# Patient Record
Sex: Male | Born: 1940 | Race: White | Hispanic: No | Marital: Married | Smoking: Former smoker
Health system: Southern US, Community
[De-identification: ages and names within clinical notes are randomized; demographics above are authoritative.]

## PROBLEM LIST (undated history)

## (undated) DIAGNOSIS — D6869 Other thrombophilia: Secondary | ICD-10-CM

## (undated) DIAGNOSIS — Z7901 Long term (current) use of anticoagulants: Secondary | ICD-10-CM

## (undated) DIAGNOSIS — G3184 Mild cognitive impairment, so stated: Secondary | ICD-10-CM

## (undated) DIAGNOSIS — M169 Osteoarthritis of hip, unspecified: Secondary | ICD-10-CM

## (undated) DIAGNOSIS — I7121 Aneurysm of the ascending aorta, without rupture: Secondary | ICD-10-CM

## (undated) DIAGNOSIS — Z9989 Dependence on other enabling machines and devices: Secondary | ICD-10-CM

## (undated) DIAGNOSIS — M1990 Unspecified osteoarthritis, unspecified site: Secondary | ICD-10-CM

## (undated) DIAGNOSIS — I447 Left bundle-branch block, unspecified: Secondary | ICD-10-CM

## (undated) DIAGNOSIS — I712 Thoracic aortic aneurysm, without rupture: Secondary | ICD-10-CM

## (undated) DIAGNOSIS — H9190 Unspecified hearing loss, unspecified ear: Secondary | ICD-10-CM

## (undated) DIAGNOSIS — E785 Hyperlipidemia, unspecified: Secondary | ICD-10-CM

## (undated) DIAGNOSIS — I480 Paroxysmal atrial fibrillation: Secondary | ICD-10-CM

## (undated) DIAGNOSIS — I2510 Atherosclerotic heart disease of native coronary artery without angina pectoris: Secondary | ICD-10-CM

## (undated) DIAGNOSIS — J45909 Unspecified asthma, uncomplicated: Secondary | ICD-10-CM

## (undated) DIAGNOSIS — G4733 Obstructive sleep apnea (adult) (pediatric): Secondary | ICD-10-CM

## (undated) DIAGNOSIS — J42 Unspecified chronic bronchitis: Secondary | ICD-10-CM

## (undated) DIAGNOSIS — K219 Gastro-esophageal reflux disease without esophagitis: Secondary | ICD-10-CM

## (undated) DIAGNOSIS — J479 Bronchiectasis, uncomplicated: Secondary | ICD-10-CM

## (undated) DIAGNOSIS — J309 Allergic rhinitis, unspecified: Secondary | ICD-10-CM

## (undated) DIAGNOSIS — J189 Pneumonia, unspecified organism: Secondary | ICD-10-CM

## (undated) HISTORY — PX: JOINT REPLACEMENT: SHX530

## (undated) HISTORY — PX: BUNIONECTOMY WITH HAMMERTOE RECONSTRUCTION: SHX5600

## (undated) HISTORY — DX: Unspecified hearing loss, unspecified ear: H91.90

## (undated) HISTORY — PX: KNEE ARTHROSCOPY: SHX127

## (undated) HISTORY — DX: Left bundle-branch block, unspecified: I44.7

## (undated) HISTORY — DX: Long term (current) use of anticoagulants: Z79.01

## (undated) HISTORY — DX: Hyperlipidemia, unspecified: E78.5

## (undated) HISTORY — DX: Paroxysmal atrial fibrillation: I48.0

## (undated) HISTORY — DX: Allergic rhinitis, unspecified: J30.9

## (undated) HISTORY — DX: Unspecified osteoarthritis, unspecified site: M19.90

## (undated) HISTORY — DX: Other thrombophilia: D68.69

## (undated) HISTORY — DX: Mild cognitive impairment of uncertain or unknown etiology: G31.84

## (undated) HISTORY — DX: Atherosclerotic heart disease of native coronary artery without angina pectoris: I25.10

---

## 1998-12-08 ENCOUNTER — Ambulatory Visit (HOSPITAL_BASED_OUTPATIENT_CLINIC_OR_DEPARTMENT_OTHER): Admission: RE | Admit: 1998-12-08 | Discharge: 1998-12-08 | Payer: Self-pay | Admitting: Orthopedic Surgery

## 2006-10-17 ENCOUNTER — Ambulatory Visit: Payer: Self-pay | Admitting: Oncology

## 2007-02-13 ENCOUNTER — Inpatient Hospital Stay (HOSPITAL_COMMUNITY): Admission: RE | Admit: 2007-02-13 | Discharge: 2007-02-16 | Payer: Self-pay | Admitting: Orthopedic Surgery

## 2007-09-28 HISTORY — PX: TOTAL HIP ARTHROPLASTY: SHX124

## 2016-10-28 HISTORY — PX: CATARACT EXTRACTION W/ INTRAOCULAR LENS IMPLANT: SHX1309

## 2017-07-13 ENCOUNTER — Encounter (HOSPITAL_COMMUNITY): Payer: Self-pay

## 2017-07-13 ENCOUNTER — Encounter (HOSPITAL_COMMUNITY)
Admission: RE | Admit: 2017-07-13 | Discharge: 2017-07-13 | Disposition: A | Discharge status: Home / Self Care | Payer: Medicare Other | Source: Ambulatory Visit | Attending: Orthopedic Surgery | Admitting: Orthopedic Surgery

## 2017-07-13 DIAGNOSIS — Z01818 Encounter for other preprocedural examination: Secondary | ICD-10-CM | POA: Insufficient documentation

## 2017-07-13 DIAGNOSIS — R9431 Abnormal electrocardiogram [ECG] [EKG]: Secondary | ICD-10-CM | POA: Insufficient documentation

## 2017-07-13 DIAGNOSIS — J40 Bronchitis, not specified as acute or chronic: Secondary | ICD-10-CM | POA: Diagnosis not present

## 2017-07-13 DIAGNOSIS — I700 Atherosclerosis of aorta: Secondary | ICD-10-CM | POA: Insufficient documentation

## 2017-07-13 HISTORY — DX: Aneurysm of the ascending aorta, without rupture: I71.21

## 2017-07-13 HISTORY — DX: Thoracic aortic aneurysm, without rupture: I71.2

## 2017-07-13 HISTORY — DX: Unspecified asthma, uncomplicated: J45.909

## 2017-07-13 HISTORY — DX: Bronchiectasis, uncomplicated: J47.9

## 2017-07-13 LAB — SURGICAL PCR SCREEN
MRSA, PCR: NEGATIVE
STAPHYLOCOCCUS AUREUS: POSITIVE — AB

## 2017-07-13 LAB — BASIC METABOLIC PANEL
ANION GAP: 7 (ref 5–15)
BUN: 16 mg/dL (ref 6–20)
CHLORIDE: 104 mmol/L (ref 101–111)
CO2: 29 mmol/L (ref 22–32)
Calcium: 9.3 mg/dL (ref 8.9–10.3)
Creatinine, Ser: 1.08 mg/dL (ref 0.61–1.24)
GFR calc non Af Amer: >60 mL/min (ref >60)
Glucose, Bld: 115 mg/dL — ABNORMAL HIGH (ref 65–99)
POTASSIUM: 3.6 mmol/L (ref 3.5–5.1)
SODIUM: 140 mmol/L (ref 135–145)

## 2017-07-13 LAB — CBC WITH DIFFERENTIAL/PLATELET
Basophils Absolute: 0 10*3/uL (ref 0.0–0.1)
Basophils Relative: 1 %
EOS ABS: 0.1 10*3/uL (ref 0.0–0.7)
Eosinophils Relative: 2 %
HEMATOCRIT: 44.7 % (ref 39.0–52.0)
HEMOGLOBIN: 15.3 g/dL (ref 13.0–17.0)
LYMPHS ABS: 1.5 10*3/uL (ref 0.7–4.0)
LYMPHS PCT: 26 %
MCH: 31 pg (ref 26.0–34.0)
MCHC: 34.2 g/dL (ref 30.0–36.0)
MCV: 90.7 fL (ref 78.0–100.0)
Monocytes Absolute: 0.4 10*3/uL (ref 0.1–1.0)
Monocytes Relative: 6 %
NEUTROS ABS: 3.7 10*3/uL (ref 1.7–7.7)
NEUTROS PCT: 65 %
Platelets: 179 10*3/uL (ref 150–400)
RBC: 4.93 MIL/uL (ref 4.22–5.81)
RDW: 13.3 % (ref 11.5–15.5)
WBC: 5.8 10*3/uL (ref 4.0–10.5)

## 2017-07-13 LAB — PROTIME-INR
INR: 0.92
PROTHROMBIN TIME: 12.3 s (ref 11.4–15.2)

## 2017-07-13 LAB — URINALYSIS, ROUTINE W REFLEX MICROSCOPIC
BILIRUBIN URINE: NEGATIVE
GLUCOSE, UA: NEGATIVE mg/dL
Hgb urine dipstick: NEGATIVE
KETONES UR: NEGATIVE mg/dL
Leukocytes, UA: NEGATIVE
NITRITE: NEGATIVE
PH: 6 (ref 5.0–8.0)
Protein, ur: NEGATIVE mg/dL
Specific Gravity, Urine: 1.02 (ref 1.005–1.030)

## 2017-07-13 LAB — TYPE AND SCREEN
ABO/RH(D): O NEG
ANTIBODY SCREEN: NEGATIVE

## 2017-07-13 LAB — APTT: aPTT: 28 seconds (ref 24–36)

## 2017-07-13 NOTE — Progress Notes (Addendum)
PCP:Dr. Ilda Bassethomas Wolf  In [place], [place] [phone]) and Dr.  Sharlotte AlamoBendetowitcz in [place], [place].  No cardiologist  Pulmonologist: Dr. Crista LuriaHellreich in [place], [place] [phone]  Sleep study .5 yrs.

## 2017-07-13 NOTE — Pre-Procedure Instructions (Signed)
Dylan SmartDavid G Matthews  07/13/2017      [id number] - [place], [place] - [address] [place] [id number] Phone: [phone] Fax: [phone]    Your procedure is scheduled on Mon. Oct. 22  [facility] at 5:30 A.M.  Call this number if you have problems the morning of surgery:  [phone]   Remember:  Do not eat food or drink liquids after midnight on Sun. Oct. 21   Take these medicines the morning of surgery with A SIP OF WATER : albuterol if needed--bring to hospital, zyrtec, advair-bring to hospital,  7 days prior to surgery STOP taking any Aspirin (unless otherwise instructed by your surgeon), Aleve, Naproxen, Ibuprofen, Motrin, Advil, Goody's, BC's, all herbal medications, fish oil, and all vitamins   Do not wear jewelry.  Do not wear lotions, powders, or perfumes, or deoderant.  Do not shave 48 hours prior to surgery.  Men may shave face and neck.  Do not bring valuables to the hospital.  [facility] is not responsible for any belongings or valuables.  Contacts, dentures or bridgework may not be worn into surgery.  Leave your suitcase in the car.  After surgery it may be brought to your room.  For patients admitted to the hospital, discharge time will be determined by your treatment team.  Patients discharged the day of surgery will not be allowed to drive home.    Special instructions:  [facility]- Preparing For Surgery  Before surgery, you can play an important role. Because skin is not sterile, your skin needs to be as free of germs as possible. You can reduce the number of germs on your skin by washing with CHG (chlorahexidine gluconate) Soap before surgery.  CHG is an antiseptic cleaner which kills germs and bonds with the skin to continue killing germs even after washing.  Please do not use if you have an allergy to CHG or antibacterial soaps. If your skin becomes reddened/irritated stop using the CHG.  Do  not shave (including legs and underarms) for at least 48 hours prior to first CHG shower. It is OK to shave your face.  Please follow these instructions carefully.   1. Shower the NIGHT BEFORE SURGERY and the MORNING OF SURGERY with CHG.   2. If you chose to wash your hair, wash your hair first as usual with your normal shampoo.  3. After you shampoo, rinse your hair and body thoroughly to remove the shampoo.  4. Use CHG as you would any other liquid soap. You can apply CHG directly to the skin and wash gently with a scrungie or a clean washcloth.   5. Apply the CHG Soap to your body ONLY FROM THE NECK DOWN.  Do not use on open wounds or open sores. Avoid contact with your eyes, ears, mouth and genitals (private parts). Wash Face and genitals (private parts)  with your normal soap.  6. Wash thoroughly, paying special attention to the area where your surgery will be performed.  7. Thoroughly rinse your body with warm water from the neck down.  8. DO NOT shower/wash with your normal soap after using and rinsing off the CHG Soap.  9. Pat yourself dry with a CLEAN TOWEL.  10. Wear CLEAN PAJAMAS to bed the night before surgery, wear comfortable clothes the morning of surgery  11. Place CLEAN SHEETS on your bed the night of your first shower and DO NOT SLEEP WITH PETS.  Day of Surgery: Do not apply any deodorants/lotions. Please wear clean clothes to the hospital/surgery center.      Please read over the following fact sheets that you were given. Coughing and Deep Breathing, Total Joint Packet, MRSA Information and Surgical Site Infection Prevention

## 2017-07-14 ENCOUNTER — Encounter (HOSPITAL_COMMUNITY): Payer: Self-pay

## 2017-07-14 DIAGNOSIS — M1612 Unilateral primary osteoarthritis, left hip: Secondary | ICD-10-CM

## 2017-07-14 HISTORY — DX: Unilateral primary osteoarthritis, left hip: M16.12

## 2017-07-14 NOTE — H&P (Signed)
TOTAL HIP ADMISSION H&P  Patient is admitted for left total hip arthroplasty.  Subjective:  Chief Complaint: left hip pain  HPI: Dylan Matthews, 76 y.o. male, has a history of pain and functional disability in the left hip(s) due to arthritis and patient has failed non-surgical conservative treatments for greater than 12 weeks to include NSAID's and/or analgesics, flexibility and strengthening excercises, use of assistive devices, weight reduction as appropriate and activity modification.  Onset of symptoms was gradual starting 5 years ago with gradually worsening course since that time.The patient noted no past surgery on the left hip(s).  Patient currently rates pain in the left hip at 10 out of 10 with activity. Patient has night pain, worsening of pain with activity and weight bearing, pain that interfers with activities of daily living and pain with passive range of motion. Patient has evidence of subchondral cysts, subchondral sclerosis, periarticular osteophytes and joint space narrowing by imaging studies. This condition presents safety issues increasing the risk of falls.   There is no current active infection.  There are no active problems to display for this patient.  Past Medical History:  Diagnosis Date  . Arthritis   . Asthma   . Bronchitis   . Sleep apnea     Past Surgical History:  Procedure Laterality Date  . BUNIONECTOMY    . CATARACT EXTRACTION W/ INTRAOCULAR LENS IMPLANT Bilateral 12/2016  . KNEE ARTHROSCOPY Right   . TOTAL HIP ARTHROPLASTY Right 2009   Dr. Turner Daniels    No current facility-administered medications for this encounter.    Current Outpatient Prescriptions  Medication Sig Dispense Refill Last Dose  . albuterol (PROVENTIL HFA;VENTOLIN HFA) 108 (90 Base) MCG/ACT inhaler Inhale 1 puff into the lungs every 4 (four) hours as needed for wheezing or shortness of breath.     . cetirizine (ZYRTEC) 10 MG tablet Take 10 mg by mouth every morning.     . fluticasone  (FLONASE) 50 MCG/ACT nasal spray Place 2 sprays into both nostrils at bedtime.     . Fluticasone-Salmeterol (ADVAIR) 250-50 MCG/DOSE AEPB Inhale 1 puff into the lungs 2 (two) times daily.     . Grape Seed Extract 100 MG CAPS Take 1 capsule by mouth daily.     Barbra Sarks MAXIMUM STRENGTH PO Take 1,000 mg by mouth daily.     . Multiple Vitamins-Minerals (MULTIVITAMIN WITH MINERALS) tablet Take 1 tablet by mouth daily.     Marland Kitchen OVER THE COUNTER MEDICATION Take 2 tablets by mouth daily. LifeGuard     . sodium chloride 0.9 % nebulizer solution Take 3 mLs by nebulization as needed for wheezing.      Allergies  Allergen Reactions  . Ciprofloxacin Other (See Comments)    Face beet red - made pt itch really bad    Social History  Substance Use Topics  . Smoking status: Former Smoker    Quit date: 07/14/1959  . Smokeless tobacco: Never Used  . Alcohol use 1.2 oz/week    1 Glasses of wine, 1 Cans of beer per week    No family history on file.   Review of Systems  Constitutional: Negative.   HENT: Negative.   Eyes: Negative.   Respiratory:       Hx of bronchitis  Cardiovascular: Negative.   Gastrointestinal: Negative.   Genitourinary: Negative.   Musculoskeletal: Positive for joint pain.  Skin: Negative.   Neurological: Negative.   Endo/Heme/Allergies: Negative.   Psychiatric/Behavioral: Negative.     Objective:  Physical  Exam  Constitutional: He is oriented to person, place, and time. He appears well-developed and well-nourished.  HENT:  Head: Normocephalic and atraumatic.  Eyes: Pupils are equal, round, and reactive to light.  Neck: Normal range of motion. Neck supple.  Cardiovascular: Intact distal pulses.   Respiratory: Effort normal.  Musculoskeletal: He exhibits tenderness.  Patient does walk with a left-sided antalgic gait.  Surgical scar to the right hip is well healed internal and external rotation, 30 each on the left side, any attempts at internal rotation causes  significant and severe pain.  Neurovascular intact distally.  Foot tap is negative.  Neurological: He is alert and oriented to person, place, and time.  Skin: Skin is warm and dry.  Psychiatric: He has a normal mood and affect. His behavior is normal. Judgment and thought content normal.    Vital signs in last 24 hours: Temp:  [97.6 F (36.4 C)] 97.6 F (36.4 C) (10/17 1013) Pulse Rate:  [61] 61 (10/17 1015) Resp:  [20] 20 (10/17 1013) BP: (148)/(72) 148/72 (10/17 1013) SpO2:  [99 %] 99 % (10/17 1013) Weight:  [88.2 kg (194 lb 6.4 oz)] 88.2 kg (194 lb 6.4 oz) (10/17 1011)  Labs:   Estimated body mass index is 28.71 kg/m as calculated from the following:   Height as of 07/13/17: 5\' 9"  (1.753 m).   Weight as of 07/13/17: 88.2 kg (194 lb 6.4 oz).   Imaging Review Plain radiographs demonstrate AP pelvis and crosstable lateral shows well-placed well fixed DePuy ASR on S-ROM hip on the right.  No evidence of ostial lysis.  On the left side.  He does have bone-on-bone arthritic changes, peripheral osteophytes, subchondral cysts and early flattening of the femoral head in the weightbearing dome.  Assessment/Plan:  End stage arthritis, left hip(s)  The patient history, physical examination, clinical judgement of the provider and imaging studies are consistent with end stage degenerative joint disease of the left hip(s) and total hip arthroplasty is deemed medically necessary. The treatment options including medical management, injection therapy, arthroscopy and arthroplasty were discussed at length. The risks and benefits of total hip arthroplasty were presented and reviewed. The risks due to aseptic loosening, infection, stiffness, dislocation/subluxation,  thromboembolic complications and other imponderables were discussed.  The patient acknowledged the explanation, agreed to proceed with the plan and consent was signed. Patient is being admitted for inpatient treatment for surgery, pain  control, PT, OT, prophylactic antibiotics, VTE prophylaxis, progressive ambulation and ADL's and discharge planning.The patient is planning to be discharged home with home health services

## 2017-07-14 NOTE — Progress Notes (Signed)
Anesthesia Chart Review:  Pt is a 76 year old male scheduled for L total hip arthroplasty anterior approach on 07/18/2017 with Gean BirchwoodFrank Rowan, MD  - PCP is Dr. Ilda Bassethomas Wolf in [place], [place] [phone]) and Dr. Sharlotte Alamo[place]endetowitcz [place], [place] - Pulmonologist is Lynnae JanuaryMark Hellreich, MD at [facility] in [place], [place] [phone]). Last office visit 04/19/17, 1 year f/u recommended.   PMH includes:  Thoracic aortic aneurysm, bronchiectasis, OSA, asthma.  Former smoker. BMI 29  Medications include: albuterol, advair  BP (!) 148/72   Pulse 61   Temp 36.4 C   Resp 20   Ht 5\' 9"  (1.753 m)   Wt 194 lb 6.4 oz (88.2 kg)   SpO2 99%   BMI 28.71 kg/m   Preoperative labs reviewed.    CXR 07/13/17: Chronic bronchitic-reactive airway changes. No pneumonia nor other acute cardiopulmonary abnormality. Thoracic aortic atherosclerosis.  CT chest 04/26/17 [facility]  1. No lung mass of pulmonary infiltrate 2. 4.3cm ascending aortic aneurysm  EKG 07/13/17: sinus bradycardia (58 bpm). LAD. LVH with QRS widening and repolarization abnormality.   If no changes, I anticipate pt can proceed with surgery as scheduled.   Rica Mastngela Marvelyn Bouchillon, FNP-BC [facility]An[facility]hesiology Phone: [phone] 07/14/2017 10:29 AM

## 2017-07-15 MED ORDER — BUPIVACAINE LIPOSOME 1.3 % IJ SUSP
20.0000 mL | INTRAMUSCULAR | Status: AC
  Administered 2017-07-18: 20 mL
  Filled 2017-07-15: qty 20

## 2017-07-15 MED ORDER — TRANEXAMIC ACID 1000 MG/10ML IV SOLN
1000.0000 mg | INTRAVENOUS | Status: AC
  Administered 2017-07-18: 1000 mg via INTRAVENOUS
  Filled 2017-07-15: qty 10

## 2017-07-15 MED ORDER — TRANEXAMIC ACID 1000 MG/10ML IV SOLN
2000.0000 mg | INTRAVENOUS | Status: AC
  Administered 2017-07-18: 2000 mg via TOPICAL
  Filled 2017-07-15: qty 20

## 2017-07-17 NOTE — Anesthesia Preprocedure Evaluation (Addendum)
Anesthesia Evaluation  Patient identified by MRN, date of birth, ID band Patient awake    Reviewed: Allergy & Precautions, NPO status , Patient's Chart, lab work & pertinent test results  History of Anesthesia Complications Negative for: history of anesthetic complications  Airway Mallampati: II  TM Distance: >3 FB Neck ROM: Full    Dental  (+) Chipped, Dental Advisory Given   Pulmonary sleep apnea (does not require CPAP) , COPD,  COPD inhaler, former smoker (quit 1960),    breath sounds clear to auscultation       Cardiovascular (-) angina+ Peripheral Vascular Disease (ascending thoracic aortic aneurysm 4.3 cm)  negative cardio ROS   Rhythm:Regular Rate:Normal     Neuro/Psych negative neurological ROS     GI/Hepatic negative GI ROS, Neg liver ROS,   Endo/Other  negative endocrine ROS  Renal/GU negative Renal ROS     Musculoskeletal  (+) Arthritis , Osteoarthritis,    Abdominal   Peds  Hematology negative hematology ROS (+)   Anesthesia Other Findings   Reproductive/Obstetrics                           Anesthesia Physical Anesthesia Plan  ASA: III  Anesthesia Plan: Spinal   Post-op Pain Management:    Induction:   PONV Risk Score and Plan: 1 and Ondansetron and Dexamethasone  Airway Management Planned: Natural Airway and Simple Face Mask  Additional Equipment:   Intra-op Plan:   Post-operative Plan:   Informed Consent: I have reviewed the patients History and Physical, chart, labs and discussed the procedure including the risks, benefits and alternatives for the proposed anesthesia with the patient or authorized representative who has indicated his/her understanding and acceptance.   Dental advisory given  Plan Discussed with: CRNA and Surgeon  Anesthesia Plan Comments: (Plan routine monitors, SAB)        Anesthesia Quick Evaluation

## 2017-07-18 ENCOUNTER — Encounter (HOSPITAL_COMMUNITY)
Admission: RE | Disposition: A | Discharge status: Home Health | Payer: Self-pay | Source: Ambulatory Visit | Attending: Orthopedic Surgery

## 2017-07-18 ENCOUNTER — Encounter (HOSPITAL_COMMUNITY): Payer: Self-pay | Admitting: *Deleted

## 2017-07-18 ENCOUNTER — Inpatient Hospital Stay (HOSPITAL_COMMUNITY): Payer: Medicare Other

## 2017-07-18 ENCOUNTER — Inpatient Hospital Stay (HOSPITAL_COMMUNITY): Payer: Medicare Other | Admitting: Emergency Medicine

## 2017-07-18 ENCOUNTER — Inpatient Hospital Stay (HOSPITAL_COMMUNITY)
Admission: RE | Admit: 2017-07-18 | Discharge: 2017-07-19 | DRG: 470 | Disposition: A | Discharge status: Home Health | Payer: Medicare Other | Source: Ambulatory Visit | Attending: Orthopedic Surgery | Admitting: Orthopedic Surgery

## 2017-07-18 ENCOUNTER — Inpatient Hospital Stay (HOSPITAL_COMMUNITY): Payer: Medicare Other | Admitting: Anesthesiology

## 2017-07-18 DIAGNOSIS — J449 Chronic obstructive pulmonary disease, unspecified: Secondary | ICD-10-CM | POA: Diagnosis not present

## 2017-07-18 DIAGNOSIS — Z7951 Long term (current) use of inhaled steroids: Secondary | ICD-10-CM

## 2017-07-18 DIAGNOSIS — K219 Gastro-esophageal reflux disease without esophagitis: Secondary | ICD-10-CM | POA: Diagnosis not present

## 2017-07-18 DIAGNOSIS — Z79899 Other long term (current) drug therapy: Secondary | ICD-10-CM

## 2017-07-18 DIAGNOSIS — J45909 Unspecified asthma, uncomplicated: Secondary | ICD-10-CM | POA: Diagnosis present

## 2017-07-18 DIAGNOSIS — Z96641 Presence of right artificial hip joint: Secondary | ICD-10-CM | POA: Diagnosis not present

## 2017-07-18 DIAGNOSIS — G4733 Obstructive sleep apnea (adult) (pediatric): Secondary | ICD-10-CM | POA: Diagnosis not present

## 2017-07-18 DIAGNOSIS — M25552 Pain in left hip: Secondary | ICD-10-CM | POA: Diagnosis present

## 2017-07-18 DIAGNOSIS — Z419 Encounter for procedure for purposes other than remedying health state, unspecified: Secondary | ICD-10-CM

## 2017-07-18 DIAGNOSIS — M1612 Unilateral primary osteoarthritis, left hip: Principal | ICD-10-CM | POA: Diagnosis present

## 2017-07-18 DIAGNOSIS — Z87891 Personal history of nicotine dependence: Secondary | ICD-10-CM | POA: Diagnosis not present

## 2017-07-18 HISTORY — DX: Unilateral primary osteoarthritis, left hip: M16.12

## 2017-07-18 HISTORY — DX: Unspecified chronic bronchitis: J42

## 2017-07-18 HISTORY — DX: Osteoarthritis of hip, unspecified: M16.9

## 2017-07-18 HISTORY — DX: Pneumonia, unspecified organism: J18.9

## 2017-07-18 HISTORY — DX: Obstructive sleep apnea (adult) (pediatric): G47.33

## 2017-07-18 HISTORY — DX: Dependence on other enabling machines and devices: Z99.89

## 2017-07-18 HISTORY — DX: Gastro-esophageal reflux disease without esophagitis: K21.9

## 2017-07-18 HISTORY — PX: TOTAL HIP ARTHROPLASTY: SHX124

## 2017-07-18 SURGERY — ARTHROPLASTY, HIP, TOTAL, ANTERIOR APPROACH
Anesthesia: Spinal | Site: Hip | Laterality: Left

## 2017-07-18 MED ORDER — TRANEXAMIC ACID 1000 MG/10ML IV SOLN
1000.0000 mg | Freq: Once | INTRAVENOUS | Status: AC
  Administered 2017-07-18: 1000 mg via INTRAVENOUS
  Filled 2017-07-18: qty 10

## 2017-07-18 MED ORDER — EPHEDRINE SULFATE-NACL 50-0.9 MG/10ML-% IV SOSY
PREFILLED_SYRINGE | INTRAVENOUS | Status: DC | PRN
  Administered 2017-07-18 (×2): 5 mg via INTRAVENOUS

## 2017-07-18 MED ORDER — GABAPENTIN 300 MG PO CAPS: 300.0000 mg | ORAL_CAPSULE | Freq: Three times a day (TID) | ORAL | 2 refills | Status: DC

## 2017-07-18 MED ORDER — OXYCODONE-ACETAMINOPHEN 5-325 MG PO TABS: 1.0000 | ORAL_TABLET | ORAL | 0 refills | Status: DC | PRN

## 2017-07-18 MED ORDER — LIDOCAINE 2% (20 MG/ML) 5 ML SYRINGE
INTRAMUSCULAR | Status: AC
  Filled 2017-07-18: qty 5

## 2017-07-18 MED ORDER — FENTANYL CITRATE (PF) 250 MCG/5ML IJ SOLN
INTRAMUSCULAR | Status: AC
  Filled 2017-07-18: qty 5

## 2017-07-18 MED ORDER — EPHEDRINE 5 MG/ML INJ
INTRAVENOUS | Status: AC
  Filled 2017-07-18: qty 10

## 2017-07-18 MED ORDER — CHLORHEXIDINE GLUCONATE 4 % EX LIQD: 60.0000 mL | Freq: Once | CUTANEOUS | Status: DC

## 2017-07-18 MED ORDER — FLEET ENEMA 7-19 GM/118ML RE ENEM: 1.0000 | ENEMA | Freq: Once | RECTAL | Status: DC | PRN

## 2017-07-18 MED ORDER — ASPIRIN EC 325 MG PO TBEC: 325.0000 mg | DELAYED_RELEASE_TABLET | Freq: Two times a day (BID) | ORAL | 0 refills | Status: DC

## 2017-07-18 MED ORDER — LACTATED RINGERS IV SOLN
INTRAVENOUS | Status: DC | PRN
  Administered 2017-07-18 (×2): via INTRAVENOUS

## 2017-07-18 MED ORDER — BISACODYL 5 MG PO TBEC: 5.0000 mg | DELAYED_RELEASE_TABLET | Freq: Every day | ORAL | Status: DC | PRN

## 2017-07-18 MED ORDER — LACTATED RINGERS IV SOLN: INTRAVENOUS | Status: DC

## 2017-07-18 MED ORDER — DEXAMETHASONE SODIUM PHOSPHATE 10 MG/ML IJ SOLN
10.0000 mg | Freq: Once | INTRAMUSCULAR | Status: AC
  Administered 2017-07-19: 10 mg via INTRAVENOUS
  Filled 2017-07-18: qty 1

## 2017-07-18 MED ORDER — OXYCODONE HCL 5 MG PO TABS: 10.0000 mg | ORAL_TABLET | ORAL | Status: DC | PRN

## 2017-07-18 MED ORDER — ONDANSETRON HCL 4 MG PO TABS: 4.0000 mg | ORAL_TABLET | Freq: Four times a day (QID) | ORAL | Status: DC | PRN

## 2017-07-18 MED ORDER — ONDANSETRON HCL 4 MG/2ML IJ SOLN: 4.0000 mg | Freq: Four times a day (QID) | INTRAMUSCULAR | Status: DC | PRN

## 2017-07-18 MED ORDER — LIDOCAINE 2% (20 MG/ML) 5 ML SYRINGE
INTRAMUSCULAR | Status: DC | PRN
Start: 2017-07-18 — End: 2017-07-18
  Administered 2017-07-18: 40 mg via INTRAVENOUS

## 2017-07-18 MED ORDER — MENTHOL 3 MG MT LOZG: 1.0000 | LOZENGE | OROMUCOSAL | Status: DC | PRN

## 2017-07-18 MED ORDER — METOCLOPRAMIDE HCL 5 MG PO TABS: 5.0000 mg | ORAL_TABLET | Freq: Three times a day (TID) | ORAL | Status: DC | PRN

## 2017-07-18 MED ORDER — BUPIVACAINE IN DEXTROSE 0.75-8.25 % IT SOLN
INTRATHECAL | Status: DC | PRN
  Administered 2017-07-18: 15 mg via INTRATHECAL

## 2017-07-18 MED ORDER — DEXAMETHASONE SODIUM PHOSPHATE 4 MG/ML IJ SOLN
INTRAMUSCULAR | Status: DC | PRN
  Administered 2017-07-18: 8 mg via INTRAVENOUS

## 2017-07-18 MED ORDER — PROPOFOL 10 MG/ML IV BOLUS
INTRAVENOUS | Status: DC | PRN
  Administered 2017-07-18 (×2): 20 mg via INTRAVENOUS

## 2017-07-18 MED ORDER — ONDANSETRON HCL 4 MG/2ML IJ SOLN
INTRAMUSCULAR | Status: AC
  Filled 2017-07-18: qty 2

## 2017-07-18 MED ORDER — HYDROMORPHONE HCL 1 MG/ML IJ SOLN: 0.2500 mg | INTRAMUSCULAR | Status: DC | PRN

## 2017-07-18 MED ORDER — PHENYLEPHRINE HCL 10 MG/ML IJ SOLN
INTRAVENOUS | Status: DC | PRN
  Administered 2017-07-18: 40 ug/min via INTRAVENOUS

## 2017-07-18 MED ORDER — LORATADINE 10 MG PO TABS
10.0000 mg | ORAL_TABLET | Freq: Every day | ORAL | Status: DC
  Administered 2017-07-18: 10 mg via ORAL
  Filled 2017-07-18 (×2): qty 1

## 2017-07-18 MED ORDER — CELECOXIB 200 MG PO CAPS: 200.0000 mg | ORAL_CAPSULE | Freq: Two times a day (BID) | ORAL | 2 refills | Status: AC

## 2017-07-18 MED ORDER — KCL IN DEXTROSE-NACL 20-5-0.45 MEQ/L-%-% IV SOLN
INTRAVENOUS | Status: DC
  Administered 2017-07-18: 13:00:00 via INTRAVENOUS
  Filled 2017-07-18: qty 1000

## 2017-07-18 MED ORDER — METHOCARBAMOL 1000 MG/10ML IJ SOLN
500.0000 mg | Freq: Four times a day (QID) | INTRAVENOUS | Status: DC | PRN
  Filled 2017-07-18: qty 5

## 2017-07-18 MED ORDER — DOCUSATE SODIUM 100 MG PO CAPS
100.0000 mg | ORAL_CAPSULE | Freq: Two times a day (BID) | ORAL | Status: DC
  Administered 2017-07-18 – 2017-07-19 (×3): 100 mg via ORAL
  Filled 2017-07-18 (×3): qty 1

## 2017-07-18 MED ORDER — ASPIRIN EC 325 MG PO TBEC
325.0000 mg | DELAYED_RELEASE_TABLET | Freq: Every day | ORAL | Status: DC
  Administered 2017-07-19: 325 mg via ORAL
  Filled 2017-07-18: qty 1

## 2017-07-18 MED ORDER — BUPIVACAINE-EPINEPHRINE (PF) 0.5% -1:200000 IJ SOLN
INTRAMUSCULAR | Status: DC | PRN
  Administered 2017-07-18: 50 mL via PERINEURAL

## 2017-07-18 MED ORDER — METHOCARBAMOL 500 MG PO TABS: 500.0000 mg | ORAL_TABLET | Freq: Four times a day (QID) | ORAL | Status: DC | PRN

## 2017-07-18 MED ORDER — DIPHENHYDRAMINE HCL 12.5 MG/5ML PO ELIX: 12.5000 mg | ORAL_SOLUTION | ORAL | Status: DC | PRN

## 2017-07-18 MED ORDER — PROPOFOL 500 MG/50ML IV EMUL
INTRAVENOUS | Status: DC | PRN
  Administered 2017-07-18: 50 ug/kg/min via INTRAVENOUS

## 2017-07-18 MED ORDER — GABAPENTIN 300 MG PO CAPS
300.0000 mg | ORAL_CAPSULE | Freq: Three times a day (TID) | ORAL | Status: DC
  Administered 2017-07-18 – 2017-07-19 (×3): 300 mg via ORAL
  Filled 2017-07-18 (×3): qty 1

## 2017-07-18 MED ORDER — ALUMINUM HYDROXIDE GEL 320 MG/5ML PO SUSP
15.0000 mL | ORAL | Status: DC | PRN
  Filled 2017-07-18: qty 30

## 2017-07-18 MED ORDER — FLUTICASONE PROPIONATE 50 MCG/ACT NA SUSP
2.0000 | Freq: Every day | NASAL | Status: DC
  Administered 2017-07-18: 2 via NASAL
  Filled 2017-07-18: qty 16

## 2017-07-18 MED ORDER — MIDAZOLAM HCL 2 MG/2ML IJ SOLN
INTRAMUSCULAR | Status: AC
  Filled 2017-07-18: qty 2

## 2017-07-18 MED ORDER — OXYCODONE HCL 5 MG PO TABS: 5.0000 mg | ORAL_TABLET | ORAL | Status: DC | PRN

## 2017-07-18 MED ORDER — CELECOXIB 200 MG PO CAPS
200.0000 mg | ORAL_CAPSULE | Freq: Two times a day (BID) | ORAL | Status: DC
  Administered 2017-07-18 – 2017-07-19 (×3): 200 mg via ORAL
  Filled 2017-07-18 (×3): qty 1

## 2017-07-18 MED ORDER — ALBUTEROL SULFATE (2.5 MG/3ML) 0.083% IN NEBU: 3.0000 mL | INHALATION_SOLUTION | RESPIRATORY_TRACT | Status: DC | PRN

## 2017-07-18 MED ORDER — MEPERIDINE HCL 25 MG/ML IJ SOLN: 6.2500 mg | INTRAMUSCULAR | Status: DC | PRN

## 2017-07-18 MED ORDER — POLYETHYLENE GLYCOL 3350 17 G PO PACK: 17.0000 g | PACK | Freq: Every day | ORAL | Status: DC | PRN

## 2017-07-18 MED ORDER — PHENYLEPHRINE 40 MCG/ML (10ML) SYRINGE FOR IV PUSH (FOR BLOOD PRESSURE SUPPORT)
PREFILLED_SYRINGE | INTRAVENOUS | Status: AC
  Filled 2017-07-18: qty 10

## 2017-07-18 MED ORDER — PHENYLEPHRINE 40 MCG/ML (10ML) SYRINGE FOR IV PUSH (FOR BLOOD PRESSURE SUPPORT)
PREFILLED_SYRINGE | INTRAVENOUS | Status: DC | PRN
  Administered 2017-07-18: 80 ug via INTRAVENOUS

## 2017-07-18 MED ORDER — ACETAMINOPHEN 650 MG RE SUPP: 650.0000 mg | RECTAL | Status: DC | PRN

## 2017-07-18 MED ORDER — BUPIVACAINE-EPINEPHRINE (PF) 0.5% -1:200000 IJ SOLN
INTRAMUSCULAR | Status: AC
  Filled 2017-07-18: qty 30

## 2017-07-18 MED ORDER — ONDANSETRON HCL 4 MG/2ML IJ SOLN
INTRAMUSCULAR | Status: DC | PRN
  Administered 2017-07-18: 4 mg via INTRAVENOUS

## 2017-07-18 MED ORDER — MOMETASONE FURO-FORMOTEROL FUM 200-5 MCG/ACT IN AERO
2.0000 | INHALATION_SPRAY | Freq: Two times a day (BID) | RESPIRATORY_TRACT | Status: DC
  Filled 2017-07-18: qty 8.8

## 2017-07-18 MED ORDER — CEFAZOLIN SODIUM-DEXTROSE 2-4 GM/100ML-% IV SOLN
2.0000 g | INTRAVENOUS | Status: AC
  Administered 2017-07-18: 2 g via INTRAVENOUS

## 2017-07-18 MED ORDER — HYDROMORPHONE HCL 1 MG/ML IJ SOLN: 1.0000 mg | INTRAMUSCULAR | Status: DC | PRN

## 2017-07-18 MED ORDER — METOCLOPRAMIDE HCL 5 MG/ML IJ SOLN: 5.0000 mg | Freq: Three times a day (TID) | INTRAMUSCULAR | Status: DC | PRN

## 2017-07-18 MED ORDER — DEXAMETHASONE SODIUM PHOSPHATE 10 MG/ML IJ SOLN
INTRAMUSCULAR | Status: AC
  Filled 2017-07-18: qty 1

## 2017-07-18 MED ORDER — 0.9 % SODIUM CHLORIDE (POUR BTL) OPTIME
TOPICAL | Status: DC | PRN
  Administered 2017-07-18: 1000 mL

## 2017-07-18 MED ORDER — MIDAZOLAM HCL 2 MG/2ML IJ SOLN: 0.5000 mg | Freq: Once | INTRAMUSCULAR | Status: DC | PRN

## 2017-07-18 MED ORDER — ACETAMINOPHEN 325 MG PO TABS: 650.0000 mg | ORAL_TABLET | ORAL | Status: DC | PRN

## 2017-07-18 MED ORDER — CEFAZOLIN SODIUM-DEXTROSE 2-4 GM/100ML-% IV SOLN
INTRAVENOUS | Status: AC
  Filled 2017-07-18: qty 100

## 2017-07-18 MED ORDER — PROMETHAZINE HCL 25 MG/ML IJ SOLN: 6.2500 mg | INTRAMUSCULAR | Status: DC | PRN

## 2017-07-18 MED ORDER — PHENOL 1.4 % MT LIQD: 1.0000 | OROMUCOSAL | Status: DC | PRN

## 2017-07-18 MED ORDER — SODIUM CHLORIDE 0.9 % IN NEBU
3.0000 mL | INHALATION_SOLUTION | RESPIRATORY_TRACT | Status: DC | PRN
  Filled 2017-07-18: qty 3

## 2017-07-18 MED ORDER — MIDAZOLAM HCL 5 MG/5ML IJ SOLN
INTRAMUSCULAR | Status: DC | PRN
  Administered 2017-07-18: 2 mg via INTRAVENOUS

## 2017-07-18 MED ORDER — TIZANIDINE HCL 2 MG PO TABS: 2.0000 mg | ORAL_TABLET | Freq: Four times a day (QID) | ORAL | 0 refills | Status: DC | PRN

## 2017-07-18 SURGICAL SUPPLY — 44 items
BAG DECANTER FOR FLEXI CONT (MISCELLANEOUS) ×2 IMPLANT
BLADE SAW SGTL 18X1.27X75 (BLADE) ×2 IMPLANT
CAPT HIP TOTAL 2 ×1 IMPLANT
COVER PERINEAL POST (MISCELLANEOUS) ×2 IMPLANT
COVER SURGICAL LIGHT HANDLE (MISCELLANEOUS) ×2 IMPLANT
DRAPE C-ARM 42X72 X-RAY (DRAPES) ×2 IMPLANT
DRAPE STERI IOBAN 125X83 (DRAPES) ×2 IMPLANT
DRAPE U-SHAPE 47X51 STRL (DRAPES) ×4 IMPLANT
DRSG AQUACEL AG ADV 3.5X10 (GAUZE/BANDAGES/DRESSINGS) ×2 IMPLANT
DURAPREP 26ML APPLICATOR (WOUND CARE) ×2 IMPLANT
ELECT BLADE 4.0 EZ CLEAN MEGAD (MISCELLANEOUS) ×2
ELECT REM PT RETURN 9FT ADLT (ELECTROSURGICAL) ×2
ELECTRODE BLDE 4.0 EZ CLN MEGD (MISCELLANEOUS) ×1 IMPLANT
ELECTRODE REM PT RTRN 9FT ADLT (ELECTROSURGICAL) ×1 IMPLANT
FACESHIELD WRAPAROUND (MASK) ×6 IMPLANT
FACESHIELD WRAPAROUND OR TEAM (MASK) ×2 IMPLANT
GLOVE BIO SURGEON STRL SZ7.5 (GLOVE) ×2 IMPLANT
GLOVE BIO SURGEON STRL SZ8.5 (GLOVE) ×2 IMPLANT
GLOVE BIOGEL PI IND STRL 8 (GLOVE) ×1 IMPLANT
GLOVE BIOGEL PI IND STRL 9 (GLOVE) ×1 IMPLANT
GLOVE BIOGEL PI INDICATOR 8 (GLOVE) ×1
GLOVE BIOGEL PI INDICATOR 9 (GLOVE) ×1
GOWN STRL REUS W/ TWL LRG LVL3 (GOWN DISPOSABLE) ×1 IMPLANT
GOWN STRL REUS W/ TWL XL LVL3 (GOWN DISPOSABLE) ×2 IMPLANT
GOWN STRL REUS W/TWL LRG LVL3 (GOWN DISPOSABLE) ×2
GOWN STRL REUS W/TWL XL LVL3 (GOWN DISPOSABLE) ×4
KIT BASIN OR (CUSTOM PROCEDURE TRAY) ×2 IMPLANT
KIT ROOM TURNOVER OR (KITS) ×2 IMPLANT
MANIFOLD NEPTUNE II (INSTRUMENTS) ×2 IMPLANT
NEEDLE HYPO 22GX1.5 SAFETY (NEEDLE) ×4 IMPLANT
NS IRRIG 1000ML POUR BTL (IV SOLUTION) ×2 IMPLANT
PACK TOTAL JOINT (CUSTOM PROCEDURE TRAY) ×2 IMPLANT
PAD ARMBOARD 7.5X6 YLW CONV (MISCELLANEOUS) ×4 IMPLANT
SLEEVE SURGEON STRL (DRAPES) ×1 IMPLANT
SUT VIC AB 1 CTX 36 (SUTURE) ×4
SUT VIC AB 1 CTX36XBRD ANBCTR (SUTURE) ×1 IMPLANT
SUT VIC AB 2-0 CT1 27 (SUTURE) ×2
SUT VIC AB 2-0 CT1 TAPERPNT 27 (SUTURE) ×1 IMPLANT
SUT VIC AB 3-0 CT1 27 (SUTURE) ×2
SUT VIC AB 3-0 CT1 TAPERPNT 27 (SUTURE) ×1 IMPLANT
SYR CONTROL 10ML LL (SYRINGE) ×4 IMPLANT
TOWEL OR 17X24 6PK STRL BLUE (TOWEL DISPOSABLE) ×2 IMPLANT
TOWEL OR 17X26 10 PK STRL BLUE (TOWEL DISPOSABLE) ×2 IMPLANT
TRAY CATH 16FR W/PLASTIC CATH (SET/KITS/TRAYS/PACK) ×1 IMPLANT

## 2017-07-18 NOTE — Op Note (Signed)
OPERATIVE REPORT    DATE OF PROCEDURE:  07/18/2017       PREOPERATIVE DIAGNOSIS:  LEFT HIP OSTEOARTHRITIS                                                          POSTOPERATIVE DIAGNOSIS:  LEFT HIP OSTEOARTHRITIS                                                           PROCEDURE: Anterior L total hip arthroplasty using a 52 mm DePuy Pinnacle  Cup, [employer], 0-degree polyethylene liner, a +5 36 mm ceramic head, a 6 std Depuy Triloc stem   SURGEON: Gean Birchwood Matthews    ASSISTANT:   Eric K. [employer]  (present throughout entire procedure and necessary for timely completion of the procedure)   ANESTHESIA: Spinal BLOOD LOSS: 300 FLUID REPLACEMENT: 1500 crystalloid Antibiotic: 2gm ancef Tranexamic Acid: 1gm IV, 2gm Topical COMPLICATIONS: none    INDICATIONS FOR PROCEDURE: A 76 y.o. year-old With  LEFT HIP OSTEOARTHRITIS   for 5 years, x-rays show bone-on-bone arthritic changes, and osteophytes. Despite conservative measures with observation, anti-inflammatory medicine, narcotics, use of a cane, has severe unremitting pain and can ambulate only a few blocks before resting. Patient desires elective L total hip arthroplasty to decrease pain and increase function. The risks, benefits, and alternatives were discussed at length including but not limited to the risks of infection, bleeding, nerve injury, stiffness, blood clots, the need for revision surgery, cardiopulmonary complications, among others, and they were willing to proceed. Questions answered     PROCEDURE IN DETAIL: The patient was identified by armband,  received preoperative IV antibiotics in the holding area at [facility], taken to the operating room , appropriate anesthetic monitors  were attached and  anesthesia was induced with the patienton the gurney. The HANA boots were applied to the feet and he was then transferred to the HANA table with a peroneal post and support underneath the non-operative le,  which was locked in 5 lb traction. Theoperative lower extremity was then prepped and draped in the usual sterile fashion from just above the iliac crest to the knee. And a timeout procedure was performed. We then made a 12 cm incision along the interval at the leading edge of the tensor fascia lata of starting at 2 cm lateral to and 2 cm distal to the ASIS. Small bleeders in the skin and subcutaneous tissue identified and cauterized we dissected down to the fascia and made an incision in the fascia allowing [place] to elevate the fascia of the tensor muscle and exploited the interval between the rectus and the tensor fascia lata. A Hohmann retractor was then placed along the superior neck of the femur and a Cobra retractor along the inferior neck of the femur we teed the capsule starting out at the superior anterior aspect of the acetabulum going distally and made the T along the neck both leaflets of the T were tagged with #2 Ethibond suture. Cobra retractors were then placed along the inferior and superior neck allowing [place] to perform a standard neck cut and  removed the femoral head with a power corkscrew. We then placed a right angle Hohmann retractor along the anterior aspect of the acetabulum a spiked Cobra in the cotyloid notch and posteriorly a Muelller retractor. We then sequentially reamed up to a 52 mm basket reamer obtaining good coverage in all quadrants, verified by C-arm imaging. Under C-arm control with and hammered into place a 52 mm Pinnacle cup in 45 of abduction and 15 of anteversion. The cup seated nicely and required no supplemental screws. We then placed a central hole Eliminator and a 0 polyethylene liner. The foot was then externally rotated to 110, the HANA elevator was placed around the flare of the greater trochanter and the limb was extended and abducted delivering the proximal femur up into the wound. A medium Hohmann retractor was placed over the greater trochanter and a Mueller retractor  along the posterior femoral neck completing the exposure. We then performed releases superiorly and and inferiorly of the capsule going back to the pirformis fossa superiorly and to the lesser trochanter inferiorly. We then entered the proximal femur with the box cutting offset chisel followed by, a canal sounder, the chili pepper and broaching up to a 6 broach. This seated nicely and we reamed the calcar. A trial reduction was performed with a 5 mm 36 mm head.The limb lengths were excellent the hip was stable in 90 of external rotation. At this point the trial components removed and we hammered into place a # 5 std  Offset Tri-Lock stem with Gryption coating. A + 5 36 mm ceramic ball was then hammered into place the hip was reduced and final C-arm images obtained. The wound was thoroughly irrigated with normal saline solution. We repaired the ant capsule and the tensor fascia lot a with running 0 vicryl suture. the subcutaneous tissue was closed with 2-0 and 3-0 Vicryl suture followed by an Aquacil dressing. At this point the patient was awaken and transferred to hospital gurney without difficulty. The subcutaneous tissue with 0 and 2-0 undyed Vicryl suture and the skin with running  3-0 vicryl subcuticular suture. Aquacil dressing was applied. The patient was then unclamped, rolled supine, awaken extubated and taken to recovery room without difficulty in stable condition.   Dylan Matthews 07/18/2017, 8:44 AM

## 2017-07-18 NOTE — Progress Notes (Signed)
Patient admitted from PACU around 1140H with no apparent distress noted. Denies any pain. Around 1220H, I heard a scream from the patient's room and found him on the floor with Dr. Turner Danielsowan trying to bring him back to bed. Patient said he fell after urinating while standing at the bedside, holding on to the walker. MD (Dr. Turner Danielsowan) was at the bedside assisting the patient at that time. The wife is at the bedside too. Vital signs were taken and stable. He had a skin tear noted on the left hand and small abrasion on the left face and forehead. Otherwise patient denies any pain.

## 2017-07-18 NOTE — Discharge Instructions (Addendum)

## 2017-07-18 NOTE — Interval H&P Note (Signed)
History and Physical Interval Note:  07/18/2017 7:11 AM  Dylan Matthews  has presented today for surgery, with the diagnosis of LEFT HIP OSTEOARTHRITIS  The various methods of treatment have been discussed with the patient and family. After consideration of risks, benefits and other options for treatment, the patient has consented to  Procedure(s): TOTAL HIP ARTHROPLASTY ANTERIOR APPROACH (Left) as a surgical intervention .  The patient's history has been reviewed, patient examined, no change in status, stable for surgery.  I have reviewed the patient's chart and labs.  Questions were answered to the patient's satisfaction.     Nestor LewandowskyOWAN,Jazari Ober J

## 2017-07-18 NOTE — Evaluation (Signed)
Physical Therapy Evaluation Patient Details Name: Dylan Matthews MRN: [id number] DOB: [birth year] Today's Date: 07/18/2017   History of Present Illness  76 y.o. male POD#0 s/p L THA (anterior). PMH includes: R THA  Clinical Impression  Patient is s/p above surgery resulting in functional limitations due to the deficits listed below (see PT Problem List). PTA, pt was independent with all mobility. Pt now requiring min guarding for transfers and gait, ambulating 150 feet today with step through gait pattern and cues for proper use of rolling walker. Pt demonstrating good static and dynamic balance at this time. Next session will focus on progressing activity tolerance and stair training.  Patient will benefit from skilled PT to increase their independence and safety with mobility to allow discharge to the venue listed below.       Follow Up Recommendations Home health PT;DC plan and follow up therapy as arranged by surgeon    Equipment Recommendations  None recommended by PT    Recommendations for Other Services OT consult     Precautions / Restrictions Precautions Precautions: Fall Restrictions Weight Bearing Restrictions: Yes LLE Weight Bearing: Weight bearing as tolerated      Mobility  Bed Mobility Overal bed mobility: Modified Independent             General bed mobility comments: Mod I for supine to sit  Transfers Overall transfer level: Needs assistance Equipment used: Rolling walker (2 wheeled) Transfers: Sit to/from Stand Sit to Stand: Min guard         General transfer comment: Cues for hand placement and techinque with walker for sit<>stand  Ambulation/Gait Ambulation/Gait assistance: Min guard Ambulation Distance (Feet): 150 Feet Assistive device: Rolling walker (2 wheeled) Gait Pattern/deviations: Step-through pattern;Decreased stance time - left;Antalgic (Cues for equal step legnth, pt able to step-through gait)     General Gait Details: Cues for safe  use of RW during mobility  Stairs            Wheelchair Mobility    Modified Rankin (Stroke Patients Only)       Balance Overall balance assessment: Needs assistance Sitting-balance support: No upper extremity supported Sitting balance-Leahy Scale: Good     Standing balance support: Bilateral upper extremity supported Standing balance-Leahy Scale: Good                               Pertinent Vitals/Pain Pain Assessment: 0-10 Pain Score: 2  Pain Location: L Hip Pain Descriptors / Indicators: Dull Pain Intervention(s): Limited activity within patient's tolerance;Monitored during session    Home Living Family/patient expects to be discharged to:: Private residence Living Arrangements: Spouse/significant other Available Help at Discharge: Available 24 hours/day Type of Home: House Home Access: Stairs to enter Entrance Stairs-Rails: None Entrance Stairs-Number of Steps: 3 Home Layout: One level Home Equipment: Environmental consultant - 2 wheels;Cane - single point Additional Comments: Has 3 in 1 commode    Prior Function Level of Independence: Independent         Comments: not using any AD prior to surgery     Hand Dominance        Extremity/Trunk Assessment        Lower Extremity Assessment Lower Extremity Assessment: LLE deficits/detail LLE Deficits / Details: Hip flexion 3+/5       Communication   Communication: No difficulties  Cognition Arousal/Alertness: Awake/alert Behavior During Therapy: WFL for tasks assessed/performed Overall Cognitive Status: Within Functional Limits for tasks assessed  General Comments      Exercises Total Joint Exercises Ankle Circles/Pumps: AROM;20 reps;Both Hip ABduction/ADduction: AROM;10 reps Straight Leg Raises: AROM;Left;10 reps Long Arc Quad: AROM;Left;10 reps   Assessment/Plan    PT Assessment Patient needs continued PT services  PT Problem List  Decreased strength;Decreased range of motion;Decreased activity tolerance;Decreased balance;Decreased knowledge of use of DME;Pain       PT Treatment Interventions DME instruction;Gait training;Stair training;Functional mobility training;Therapeutic activities;Therapeutic exercise    PT Goals (Current goals can be found in the Care Plan section)  Acute Rehab PT Goals Patient Stated Goal: d/c home with home health PT and family support PT Goal Formulation: With patient/family Time For Goal Achievement: 07/22/17 Potential to Achieve Goals: Good    Frequency 7X/week   Barriers to discharge        Co-evaluation               AM-PAC PT "6 Clicks" Daily Activity  Outcome Measure Difficulty turning over in bed (including adjusting bedclothes, sheets and blankets)?: A Little Difficulty moving from lying on back to sitting on the side of the bed? : A Little Difficulty sitting down on and standing up from a chair with arms (e.g., wheelchair, bedside commode, etc,.)?: A Little Help needed moving to and from a bed to chair (including a wheelchair)?: A Little Help needed walking in hospital room?: A Little Help needed climbing 3-5 steps with a railing? : A Little 6 Click Score: 18    End of Session Equipment Utilized During Treatment: Gait belt Activity Tolerance: Patient tolerated treatment well Patient left: in chair;with call bell/phone within reach;with family/visitor present Nurse Communication: Mobility status PT Visit Diagnosis: Other abnormalities of gait and mobility (R26.89);Muscle weakness (generalized) (M62.81);Pain Pain - Right/Left: Left Pain - part of body: Hip    Time: 0310-0350 PT Time Calculation (min) (ACUTE ONLY): 40 min   Charges:   PT Evaluation $PT Eval Low Complexity: 1 Low PT Treatments $Gait Training: 8-22 mins $Therapeutic Exercise: 8-22 mins   PT G Codes:        Etta GrandchildSean Denaja Verhoeven, PT, DPT Acute Rehab [phone]   Etta GrandchildSean   Jiles Goya 07/18/2017, 4:46 PM

## 2017-07-18 NOTE — Addendum Note (Signed)
Addendum  created 07/18/17 1324 by Jairo BenJackson, Lavanya Roa, MD   Anesthesia Attestations filed, Sign clinical note

## 2017-07-18 NOTE — Anesthesia Postprocedure Evaluation (Signed)
Anesthesia Post Note  Patient: Dylan Matthews  Procedure(s) Performed: TOTAL HIP ARTHROPLASTY ANTERIOR APPROACH (Left Hip)     Anesthesia Post Evaluation  Last Vitals:  Vitals:   07/18/17 0559 07/18/17 0922  BP: (!) 154/99   Pulse: 65 70  Temp: 36.6 C (!) 36.1 C  SpO2:  100%    Last Pain:  Vitals:   07/18/17 0559  TempSrc: Oral                 Julian ReilPaul F Julaine Zimny

## 2017-07-18 NOTE — Anesthesia Postprocedure Evaluation (Addendum)
Anesthesia Post Note  Patient: Dylan Matthews  Procedure(s) Performed: TOTAL HIP ARTHROPLASTY ANTERIOR APPROACH (Left Hip)     Patient location during evaluation: PACU Anesthesia Type: Spinal Level of consciousness: awake and alert, patient cooperative and oriented Pain management: pain level controlled Vital Signs Assessment: post-procedure vital signs reviewed and stable Respiratory status: spontaneous breathing, nonlabored ventilation, respiratory function stable and patient connected to nasal cannula oxygen Cardiovascular status: blood pressure returned to baseline and stable Postop Assessment: patient able to bend at knees, no apparent nausea or vomiting and spinal receding Anesthetic complications: no    Last Vitals:  Vitals:   07/18/17 1100 07/18/17 1233  BP: 114/68 126/68  Pulse: (!) 55 67  Resp: 12 16  Temp: 36.7 C (!) 36.4 C  SpO2: 98% 99%    Last Pain:  Vitals:   07/18/17 1233  TempSrc: Oral  PainSc:                  Charae Depaolis,E. Danyeal Akens

## 2017-07-18 NOTE — Anesthesia Procedure Notes (Signed)
Procedure Name: MAC Date/Time: 07/18/2017 7:41 AM Performed by: Orlie Dakin Pre-anesthesia Checklist: Patient identified, Emergency Drugs available, Suction available, Patient being monitored and Timeout performed Oxygen Delivery Method: Nasal cannula

## 2017-07-18 NOTE — Anesthesia Procedure Notes (Signed)
Procedure Name: MAC Date/Time: 07/18/2017 7:49 AM Performed by: Orlie Dakin Oxygen Delivery Method: Simple face mask

## 2017-07-18 NOTE — Transfer of Care (Signed)
Immediate Anesthesia Transfer of Care Note  Patient: Dylan Matthews  Procedure(s) Performed: TOTAL HIP ARTHROPLASTY ANTERIOR APPROACH (Left Hip)  Patient Location: PACU  Anesthesia Type:Spinal  Level of Consciousness: awake, alert , oriented and patient cooperative  Airway & Oxygen Therapy: Patient Spontanous Breathing  Post-op Assessment: Report given to RN and Post -op Vital signs reviewed and stable  Post vital signs: Reviewed and stable  Last Vitals:  Vitals:   07/18/17 0559 07/18/17 0922  BP: (!) 154/99   Pulse: 65 70  Temp: 36.6 C (!) 36.1 C  SpO2:  100%    Last Pain:  Vitals:   07/18/17 0559  TempSrc: Oral      Patients Stated Pain Goal: 7 (07/18/17 0559)  Complications: No apparent anesthesia complications

## 2017-07-18 NOTE — Anesthesia Procedure Notes (Signed)
Spinal  Patient location during procedure: OR End time: 07/18/2017 7:36 AM Staffing Anesthesiologist: Jairo BenJACKSON, Edin Skarda Performed: anesthesiologist  Preanesthetic Checklist Completed: patient identified, site marked, surgical consent, pre-op evaluation, timeout performed, IV checked, risks and benefits discussed and monitors and equipment checked Spinal Block Patient position: sitting Prep: ChloraPrep and site prepped and draped Patient monitoring: blood pressure, continuous pulse ox, cardiac monitor and heart rate Approach: midline Location: L3-4 Injection technique: single-shot Needle Needle type: Quincke  Needle gauge: 25 G Needle length: 9 cm Additional Notes Pt identified in Operating room.  Monitors applied. Working IV access confirmed. Sterile prep, drape lumbar spine.  1% lido local L 3,4.  #25ga Quincke into clear CSF L 3,4.  15mg  0.75% Bupivacaine with dextrose injected with asp CSF beginning and end of injection.  Patient asymptomatic, VSS, no heme aspirated, tolerated well.  Sandford Craze Artice Holohan, MD

## 2017-07-19 ENCOUNTER — Encounter (HOSPITAL_COMMUNITY): Payer: Self-pay | Admitting: Orthopedic Surgery

## 2017-07-19 LAB — CBC
HCT: 33.5 % — ABNORMAL LOW (ref 39.0–52.0)
Hemoglobin: 11.4 g/dL — ABNORMAL LOW (ref 13.0–17.0)
MCH: 30.6 pg (ref 26.0–34.0)
MCHC: 34 g/dL (ref 30.0–36.0)
MCV: 89.8 fL (ref 78.0–100.0)
PLATELETS: 157 10*3/uL (ref 150–400)
RBC: 3.73 MIL/uL — AB (ref 4.22–5.81)
RDW: 13.2 % (ref 11.5–15.5)
WBC: 13.2 10*3/uL — AB (ref 4.0–10.5)

## 2017-07-19 LAB — BASIC METABOLIC PANEL
ANION GAP: 6 (ref 5–15)
BUN: 12 mg/dL (ref 6–20)
CALCIUM: 8.7 mg/dL — AB (ref 8.9–10.3)
CO2: 26 mmol/L (ref 22–32)
Chloride: 105 mmol/L (ref 101–111)
Creatinine, Ser: 1.05 mg/dL (ref 0.61–1.24)
GFR calc Af Amer: >60 mL/min (ref >60)
Glucose, Bld: 125 mg/dL — ABNORMAL HIGH (ref 65–99)
POTASSIUM: 4.2 mmol/L (ref 3.5–5.1)
SODIUM: 137 mmol/L (ref 135–145)

## 2017-07-19 NOTE — Progress Notes (Addendum)
All discharge instructions reviewed in detail with pt and wife with understanding verbalized. No questions or concerns prior to discharge. Pt is ambulatory in room independently. Denies any pain or discomfort. Discharged to home in stable condition with wife.

## 2017-07-19 NOTE — Progress Notes (Signed)
Physical Therapy Treatment Patient Details Name: Dylan Matthews MRN: [id number] DOB: 1941/06/08 Today's Date: 07/19/2017    History of Present Illness 76 y.o. male POD#1 s/p L THA (anterior). PMH includes: R THA    PT Comments    Session focused on stair and gait training, and reinforcing therex techniques in preparation for d/c home today. Pt demonstrating improvement in dynamic balance with mobility, and is ambulating without assistive device with supervision. Pt ascended/descended 24 stairs with reciprocal stepping pattern and 1 railing for support.  Pt has met all functional acute PT goals, and pt/family have no concerns are questions at this time. PT to sign off, recommending home health PT when medically cleared for d/c.    Follow Up Recommendations  DC plan and follow up therapy as arranged by surgeon;Home health PT     Equipment Recommendations       Recommendations for Other Services       Precautions / Restrictions Precautions Precautions: Fall Restrictions Weight Bearing Restrictions: Yes LLE Weight Bearing: Weight bearing as tolerated    Mobility  Bed Mobility Overal bed mobility: Modified Independent                Transfers Overall transfer level: Modified independent Equipment used: None Transfers: Sit to/from Stand Sit to Stand: Modified independent (Device/Increase time)         General transfer comment: Pt no longer using RW for sit<>stand transfers  Ambulation/Gait Ambulation/Gait assistance: Supervision Ambulation Distance (Feet): 200 Feet Assistive device: None Gait Pattern/deviations: Antalgic;Step-through pattern;Decreased step length - left Gait velocity: normal   General Gait Details: Pt ambulating without ADs    Stairs Stairs: Yes   Stair Management: One rail Right;One rail Left Number of Stairs: 24 General stair comments: Pt cued for safety and sequencing. Able to utilize reciprocal pattern with 1 rail support  Wheelchair  Mobility    Modified Rankin (Stroke Patients Only)       Balance Overall balance assessment: Needs assistance Sitting-balance support: No upper extremity supported Sitting balance-Leahy Scale: Normal     Standing balance support: No upper extremity supported Standing balance-Leahy Scale: Good                              Cognition Arousal/Alertness: Awake/alert Behavior During Therapy: WFL for tasks assessed/performed Overall Cognitive Status: Within Functional Limits for tasks assessed                                        Exercises Total Joint Exercises Hip ABduction/ADduction: AROM;10 reps Straight Leg Raises: AROM;Left;10 reps Long Arc Quad: AROM;Left;10 reps Knee Flexion: AAROM;Left;10 reps Marching in Standing: AROM;10 reps;Both Standing Hip Extension: AROM;10 reps;Both    General Comments General comments (skin integrity, edema, etc.): Education of new therex, cues for form and techinque       Pertinent Vitals/Pain Pain Assessment: 0-10 Pain Score: 1  Pain Location: L Hip Pain Descriptors / Indicators: Dull Pain Intervention(s): Limited activity within patient's tolerance;Repositioned    Home Living                      Prior Function            PT Goals (current goals can now be found in the care plan section) Acute Rehab PT Goals Patient Stated Goal: d/c home with home health PT  and family support PT Goal Formulation: With patient/family Time For Goal Achievement: 07/22/17 Potential to Achieve Goals: Good Progress towards PT goals: Goals met/education completed, patient discharged from PT    Frequency           PT Plan Current plan remains appropriate    Co-evaluation              AM-PAC PT "6 Clicks" Daily Activity  Outcome Measure  Difficulty turning over in bed (including adjusting bedclothes, sheets and blankets)?: A Little Difficulty moving from lying on back to sitting on the side of  the bed? : A Little Difficulty sitting down on and standing up from a chair with arms (e.g., wheelchair, bedside commode, etc,.)?: None Help needed moving to and from a bed to chair (including a wheelchair)?: None Help needed walking in hospital room?: None Help needed climbing 3-5 steps with a railing? : A Little 6 Click Score: 21    End of Session Equipment Utilized During Treatment: Gait belt Activity Tolerance: Patient tolerated treatment well Patient left: in chair;with family/visitor present;with call bell/phone within reach Nurse Communication: Mobility status PT Visit Diagnosis: Pain;Muscle weakness (generalized) (M62.81);Other abnormalities of gait and mobility (R26.89) Pain - Right/Left: Left Pain - part of body: Hip     Time: 0920-0950 PT Time Calculation (min) (ACUTE ONLY): 30 min  Charges:  $Gait Training: 8-22 mins $Therapeutic Exercise: 8-22 mins                    G Codes:       Reinaldo Berber, PT, DPT Acute Rehab Services Pager: [phone]   Reinaldo Berber 07/19/2017, 10:23 AM

## 2017-07-19 NOTE — Plan of Care (Signed)
Problem: Activity: Goal: Will remain free from falls Outcome: Not Met (add Reason) Fall occurred 10/22  Comments: Pt remained free from falls remainder of hospitalization. Ambulates independently without difficulty.  

## 2017-07-19 NOTE — Discharge Summary (Signed)
Patient ID: Dylan Matthews MRN: [id number] DOB/AGE: [birth year] 76 y.o.  Admit date: 07/18/2017 Discharge date: 07/19/2017  Admission Diagnoses:  Principal Problem:   Osteoarthritis of left hip Active Problems:   Primary osteoarthritis of left hip   Discharge Diagnoses:  Same  Past Medical History:  Diagnosis Date  . Asthma    "very mild stress asthma" (07/18/2017)  . Bronchiectasis (HCC)    "very mild in my LLL" (07/18/2017)  . Chronic bronchitis (HCC)   . Degenerative arthritis of hip    "both hips" (07/18/2017)  . GERD (gastroesophageal reflux disease)    "a long time ago" (07/18/2017)  . OSA on CPAP   . Pneumonia    "4 times; last time was in ` 2014" (07/18/2017)  . Thoracic ascending aortic aneurysm (HCC)    stable by 04/26/17 CT [facility])    Surgeries: Procedure(s): TOTAL HIP ARTHROPLASTY ANTERIOR APPROACH on 07/18/2017   Consultants:   Discharged Condition: Improved  Hospital Course: Dylan Matthews is an 76 y.o. male who was admitted 07/18/2017 for operative treatment ofOsteoarthritis of left hip. Patient has severe unremitting pain that affects sleep, daily activities, and work/hobbies. After pre-op clearance the patient was taken to the operating room on 07/18/2017 and underwent  Procedure(s): TOTAL HIP ARTHROPLASTY ANTERIOR APPROACH.    Patient was given perioperative antibiotics: Anti-infectives    Start     Dose/Rate Route Frequency Ordered Stop   07/18/17 0552  ceFAZolin (ANCEF) 2-4 GM/100ML-% IVPB    Comments:  Kathrene Bongo   : cabinet override      07/18/17 0552 07/18/17 0727   07/18/17 0546  ceFAZolin (ANCEF) IVPB 2g/100 mL premix     2 g 200 mL/hr over 30 Minutes Intravenous On call to O.R. 07/18/17 4098 07/18/17 0737       Patient was given sequential compression devices, early ambulation, and chemoprophylaxis to prevent DVT.  Patient benefited maximally from hospital stay and there were no complications.    Recent vital  signs: Patient Vitals for the past 24 hrs:  BP Temp Temp src Pulse Resp SpO2  07/19/17 0645 106/67 97.8 F (36.6 C) Oral 64 18 98 %  07/18/17 2025 115/69 98 F (36.7 C) Oral 75 18 99 %  07/18/17 1810 - - - 81 - 98 %  07/18/17 1715 108/71 97.6 F (36.4 C) Oral 86 18 98 %  07/18/17 1300 135/67 97.6 F (36.4 C) Oral 69 - 98 %  07/18/17 1233 126/68 (!) 97.5 F (36.4 C) Oral 67 16 99 %  07/18/17 1100 114/68 98 F (36.7 C) - (!) 55 12 98 %  07/18/17 1045 119/73 - - (!) 59 20 97 %  07/18/17 1030 - - - (!) 58 (!) 21 100 %  07/18/17 1015 121/78 - - 65 18 100 %  07/18/17 1000 120/67 - - 60 15 100 %  07/18/17 0945 - - - 63 12 100 %  07/18/17 0930 107/65 - - 67 (!) 23 98 %  07/18/17 0922 91/71 (!) 97 F (36.1 C) - 70 (!) 22 100 %     Recent laboratory studies: No results for input(s): WBC, HGB, HCT, PLT, NA, K, CL, CO2, BUN, CREATININE, GLUCOSE, INR, CALCIUM in the last 72 hours.  Invalid input(s): PT, 2   Discharge Medications:   Allergies as of 07/19/2017      Reactions   Ciprofloxacin Itching, Other (See Comments)   Face beet red - made pt itch really bad  Medication List    TAKE these medications   albuterol 108 (90 Base) MCG/ACT inhaler Commonly known as:  PROVENTIL HFA;VENTOLIN HFA Inhale 1 puff into the lungs every 4 (four) hours as needed for wheezing or shortness of breath.   aspirin EC 325 MG tablet Take 1 tablet (325 mg total) by mouth 2 (two) times daily.   celecoxib 200 MG capsule Commonly known as:  CELEBREX Take 1 capsule (200 mg total) by mouth 2 (two) times daily.   cetirizine 10 MG tablet Commonly known as:  ZYRTEC Take 10 mg by mouth every morning.   fluticasone 50 MCG/ACT nasal spray Commonly known as:  FLONASE Place 2 sprays into both nostrils at bedtime.   Fluticasone-Salmeterol 250-50 MCG/DOSE Aepb Commonly known as:  ADVAIR Inhale 1 puff into the lungs 2 (two) times daily.   gabapentin 300 MG capsule Commonly known as:  NEURONTIN Take 1  capsule (300 mg total) by mouth 3 (three) times daily.   Grape Seed Extract 100 MG Caps Take 1 capsule by mouth daily.   L-ARGININE MAXIMUM STRENGTH PO Take 1,000 mg by mouth daily.   multivitamin with minerals tablet Take 1 tablet by mouth daily.   OVER THE COUNTER MEDICATION Take 2 tablets by mouth daily. LifeGuard   oxyCODONE-acetaminophen 5-325 MG tablet Commonly known as:  ROXICET Take 1 tablet by mouth every 4 (four) hours as needed.   sodium chloride 0.9 % nebulizer solution Take 3 mLs by nebulization as needed for wheezing.   tiZANidine 2 MG tablet Commonly known as:  ZANAFLEX Take 1 tablet (2 mg total) by mouth every 6 (six) hours as needed for muscle spasms.            Durable Medical Equipment        Start     Ordered   07/18/17 1132  DME Walker rolling  Once    Question:  Patient needs a walker to treat with the following condition  Answer:  Status post total hip replacement, left   07/18/17 1131   07/18/17 1132  DME 3 n 1  Once     07/18/17 1131   07/18/17 1132  DME Bedside commode  Once    Question:  Patient needs a bedside commode to treat with the following condition  Answer:  Status post total hip replacement, left   07/18/17 1131       Discharge Care Instructions        Start     Ordered   07/19/17 0000  Change dressing    Comments:  You may change your dressing if > 40% drainage   07/19/17 0728      Diagnostic Studies: Dg Chest 2 View  Result Date: 07/13/2017 CLINICAL DATA:  Preoperative examination prior to hip replacement surgery. History of asthma and bronchitis. Former smoker. EXAM: CHEST  2 VIEW COMPARISON:  Chest x-ray of Feb 10, 2007 FINDINGS: The lungs are adequately inflated and clear. There is mild hemidiaphragm flattening which chronic. The heart and pulmonary vascularity are normal. The mediastinum is normal in width. There is calcification in the wall of the aortic arch. There is multilevel degenerative disc disease of the  thoracic spine. There is mild dextrocurvature centered in the midthoracic spine. IMPRESSION: Chronic bronchitic-reactive airway changes. No pneumonia nor other acute cardiopulmonary abnormality. Thoracic aortic atherosclerosis. Electronically Signed   By: Keiji  Swazil[place] M.D.   On: 07/13/2017 13:36   Dg C-arm 1-60 Min  Result Date: 07/18/2017 CLINICAL DATA:  Left hip replacement  EXAM: OPERATIVE LEFT HIP WITH PELVIS; DG C-ARM 61-120 MIN COMPARISON:  None. FLUOROSCOPY TIME:  Fluoroscopy Time:  17 seconds Radiation Exposure Index (if provided by the fluoroscopic device): Not available Number of Acquired Spot Images: 1 FINDINGS: Single spot film demonstrates changes consistent with a left total hip arthroplasty. The prosthetic is in satisfactory position. No acute bony or soft tissue abnormality is noted. IMPRESSION: Status post left hip replacement Electronically Signed   By: Alcide Clever M.D.   On: 07/18/2017 09:10   Dg Hip Operative Unilat W Or W/o Pelvis Left  Result Date: 07/18/2017 CLINICAL DATA:  Left hip replacement EXAM: OPERATIVE LEFT HIP WITH PELVIS; DG C-ARM 61-120 MIN COMPARISON:  None. FLUOROSCOPY TIME:  Fluoroscopy Time:  17 seconds Radiation Exposure Index (if provided by the fluoroscopic device): Not available Number of Acquired Spot Images: 1 FINDINGS: Single spot film demonstrates changes consistent with a left total hip arthroplasty. The prosthetic is in satisfactory position. No acute bony or soft tissue abnormality is noted. IMPRESSION: Status post left hip replacement Electronically Signed   By: Alcide Clever M.D.   On: 07/18/2017 09:10    Disposition:   Discharge Instructions    Call MD / Call 911    Complete by:  As directed    If you experience chest pain or shortness of breath, CALL 911 and be transported to the hospital emergency room.  If you develope a fever above 101 F, pus (white drainage) or increased drainage or redness at the wound, or calf pain, call your surgeon's  office.   Change dressing    Complete by:  As directed    You may change your dressing if > 40% drainage   Constipation Prevention    Complete by:  As directed    Drink plenty of fluids.  Prune juice may be helpful.  You may use a stool softener, such as Colace (over the counter) 100 mg twice a day.  Use MiraLax (over the counter) for constipation as needed.   Diet - low sodium heart healthy    Complete by:  As directed    Driving restrictions    Complete by:  As directed    No driving for 1 weeks   Increase activity slowly as tolerated    Complete by:  As directed       Follow-up Information    Gean Birchwood, MD Follow up in 2 week(s).   Specialty:  Orthopedic Surgery Contact information: [address] [place] [address] [phone]            Signed: Nestor Lewandowsky 07/19/2017, 7:29 AM

## 2017-07-19 NOTE — Progress Notes (Signed)
PATIENT ID: Dylan Matthews  MRN: [id number]  DOB/AGE:  76-Mar-1942 / 76 y.o.  1 Day Post-Op Procedure(s) (LRB): TOTAL HIP ARTHROPLASTY ANTERIOR APPROACH (Left)    PROGRESS NOTE Subjective: Patient is alert, oriented, no Nausea, no Vomiting, yes passing gas, . Taking PO well. Denies SOB, Chest or Calf Pain. Using Incentive Spirometer, PAS in place. Ambulate 150' Patient reports pain as  0/10  .    Objective: Vital signs in last 24 hours: Vitals:   07/18/17 1715 07/18/17 1810 07/18/17 2025 07/19/17 0645  BP: 108/71  115/69 106/67  Pulse: 86 81 75 64  Resp: 18  18 18   Temp: 97.6 F (36.4 C)  98 F (36.7 C) 97.8 F (36.6 C)  TempSrc: Oral  Oral Oral  SpO2: 98% 98% 99% 98%      Intake/Output from previous day: I/O last 3 completed shifts: In: 2030.4 [P.O.:120; I.V.:1910.4] Out: 1475 [Urine:1175; Blood:300]   Intake/Output this shift: No intake/output data recorded.   LABORATORY DATA: No results for input(s): WBC, HGB, HCT, PLT, NA, K, CL, CO2, BUN, CREATININE, GLUCOSE, GLUCAP, INR, CALCIUM in the last 72 hours.  Invalid input(s): PT, 2  Examination: Neurologically intact ABD soft Neurovascular intact Sensation intact distally Intact pulses distally Dorsiflexion/Plantar flexion intact Incision: dressing C/D/I No cellulitis present Compartment soft} XR AP&Lat of hip shows well placed\fixed THA  Assessment:   1 Day Post-Op Procedure(s) (LRB): TOTAL HIP ARTHROPLASTY ANTERIOR APPROACH (Left) ADDITIONAL DIAGNOSIS:  Expected Acute Blood Loss Anemia,   Plan: PT/OT WBAT, THA  DVT Prophylaxis: SCDx72 hrs, ASA 325 mg BID x 2 weeks  DISCHARGE PLAN: Home, today  DISCHARGE NEEDS: HHPT, Walker and 3-in-1 comode seat  Patient ID: Dylan Matthews, male   DOB: 08-Dec-1940, 76 y.o.   MRN: [id number]

## 2017-07-19 NOTE — Care Management Note (Signed)
Case Management Note  Patient Details  Name: Dylan Matthews MRN: [id number] Date of Birth: 20-Jun-1941  Subjective/Objective:     76 yr old gentleman,s/p left total hip arthroplasty.                Action/Plan: Case manager spoke with patient concerning discharge plan and DME needs. Dr. Bartolo Darterye was preoperatively setup with [facility], no changes.He says he has RW, 3in1 and several canes. Will have support from his wife at discharge.    Expected Discharge Date:  07/19/17               Expected Discharge Plan:  Home w Home Health Services  In-House Referral:  NA  Discharge planning Services  CM Consult  Post Acute Care Choice:  Home Health Choice offered to:  Patient  DME Arranged:  N/A (has 3in1, RW and canes) DME Agency:  NA  HH Arranged:  PT HH Agency:  [facility] (formerly [facility])  Status of Service:  Completed, signed off  If discussed at [employer] Meetings, dates discussed:    Additional Comments:  Durenda GuthrieBrady, Dylan Karczewski Naomi, RN 07/19/2017, 10:33 AM

## 2019-08-16 IMAGING — RF DG C-ARM 61-120 MIN
1 series · 1 of 1 positions shown · non-contrast
Comparison: None.

CLINICAL DATA: Left hip replacement

EXAM:
OPERATIVE LEFT HIP WITH PELVIS; DG C-ARM 61-120 MIN

[Series 1: run · 1 of 1 slices shown]
[im 1/1]
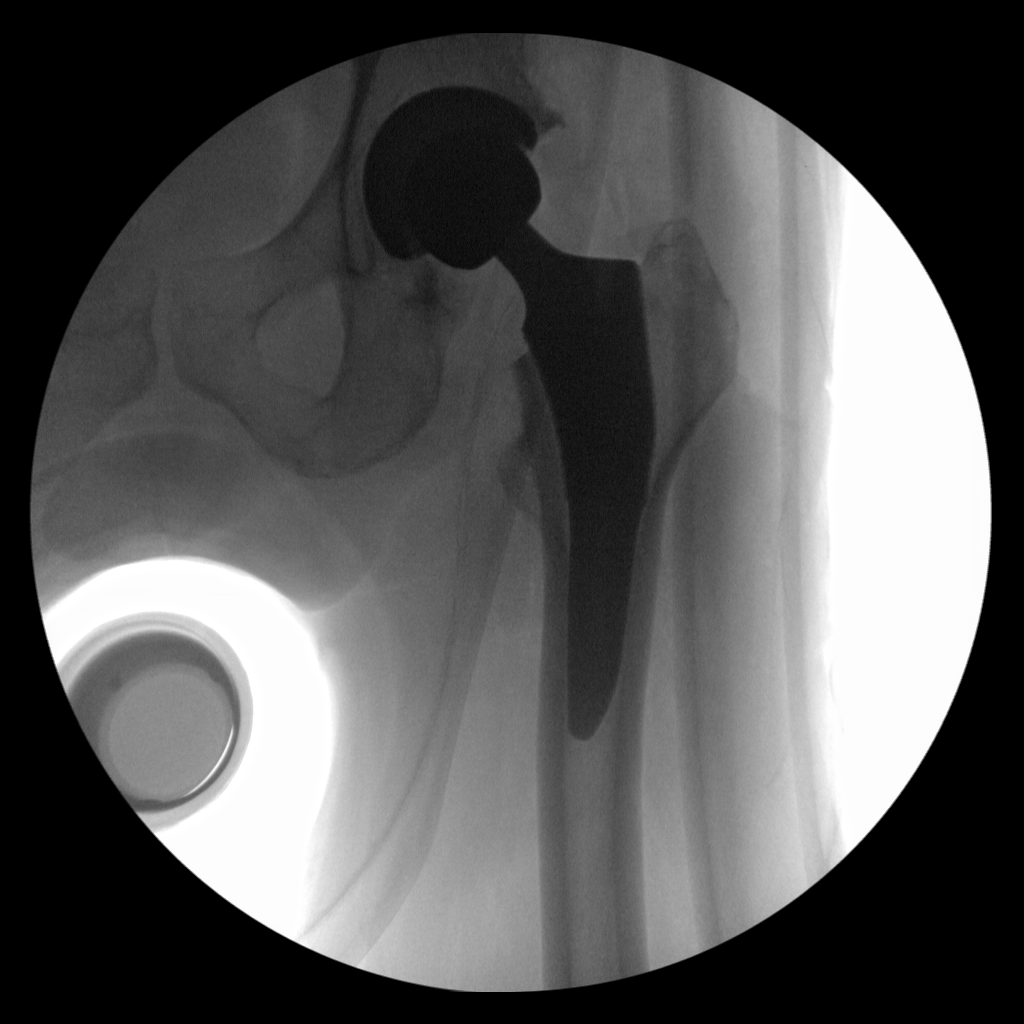

[1 of 1 positions shown; findings below may reference images not displayed]

FLUOROSCOPY TIME:  Fluoroscopy Time:  17 seconds

Radiation Exposure Index (if provided by the fluoroscopic device):
Not available

Number of Acquired Spot Images: 1
FINDINGS: Single spot film demonstrates changes consistent with a left total
hip arthroplasty. The prosthetic is in satisfactory position. No
acute bony or soft tissue abnormality is noted.
IMPRESSION: Status post left hip replacement

## 2024-03-16 LAB — LAB REPORT - SCANNED: EGFR: 64.1

## 2024-04-27 ENCOUNTER — Other Ambulatory Visit: Payer: Self-pay

## 2024-04-27 DIAGNOSIS — E785 Hyperlipidemia, unspecified: Secondary | ICD-10-CM | POA: Insufficient documentation

## 2024-04-27 DIAGNOSIS — I480 Paroxysmal atrial fibrillation: Secondary | ICD-10-CM | POA: Insufficient documentation

## 2024-04-27 DIAGNOSIS — M1990 Unspecified osteoarthritis, unspecified site: Secondary | ICD-10-CM | POA: Insufficient documentation

## 2024-04-27 DIAGNOSIS — I7121 Aneurysm of the ascending aorta, without rupture: Secondary | ICD-10-CM | POA: Insufficient documentation

## 2024-04-27 DIAGNOSIS — I447 Left bundle-branch block, unspecified: Secondary | ICD-10-CM | POA: Insufficient documentation

## 2024-04-27 DIAGNOSIS — G3184 Mild cognitive impairment, so stated: Secondary | ICD-10-CM | POA: Insufficient documentation

## 2024-04-27 DIAGNOSIS — D6869 Other thrombophilia: Secondary | ICD-10-CM | POA: Insufficient documentation

## 2024-04-27 DIAGNOSIS — Z7901 Long term (current) use of anticoagulants: Secondary | ICD-10-CM | POA: Insufficient documentation

## 2024-04-27 DIAGNOSIS — G4733 Obstructive sleep apnea (adult) (pediatric): Secondary | ICD-10-CM | POA: Insufficient documentation

## 2024-04-27 DIAGNOSIS — K219 Gastro-esophageal reflux disease without esophagitis: Secondary | ICD-10-CM | POA: Insufficient documentation

## 2024-04-27 DIAGNOSIS — J309 Allergic rhinitis, unspecified: Secondary | ICD-10-CM | POA: Insufficient documentation

## 2024-04-27 DIAGNOSIS — I2510 Atherosclerotic heart disease of native coronary artery without angina pectoris: Secondary | ICD-10-CM | POA: Insufficient documentation

## 2024-04-27 DIAGNOSIS — J479 Bronchiectasis, uncomplicated: Secondary | ICD-10-CM | POA: Insufficient documentation

## 2024-04-27 DIAGNOSIS — H9190 Unspecified hearing loss, unspecified ear: Secondary | ICD-10-CM | POA: Insufficient documentation

## 2024-04-27 DIAGNOSIS — J189 Pneumonia, unspecified organism: Secondary | ICD-10-CM | POA: Insufficient documentation

## 2024-04-27 DIAGNOSIS — J42 Unspecified chronic bronchitis: Secondary | ICD-10-CM | POA: Insufficient documentation

## 2024-04-27 DIAGNOSIS — J45909 Unspecified asthma, uncomplicated: Secondary | ICD-10-CM | POA: Insufficient documentation

## 2024-04-27 DIAGNOSIS — M169 Osteoarthritis of hip, unspecified: Secondary | ICD-10-CM | POA: Insufficient documentation

## 2024-05-01 ENCOUNTER — Encounter: Payer: Self-pay | Admitting: Cardiology

## 2024-05-01 ENCOUNTER — Ambulatory Visit: Attending: Cardiology | Admitting: Cardiology

## 2024-05-01 VITALS — BP 122/70 | HR 53 | Ht 67.0 in | Wt 173.0 lb

## 2024-05-01 DIAGNOSIS — G4733 Obstructive sleep apnea (adult) (pediatric): Secondary | ICD-10-CM | POA: Insufficient documentation

## 2024-05-01 DIAGNOSIS — Z7901 Long term (current) use of anticoagulants: Secondary | ICD-10-CM | POA: Diagnosis present

## 2024-05-01 DIAGNOSIS — I7121 Aneurysm of the ascending aorta, without rupture: Secondary | ICD-10-CM | POA: Insufficient documentation

## 2024-05-01 DIAGNOSIS — E785 Hyperlipidemia, unspecified: Secondary | ICD-10-CM | POA: Insufficient documentation

## 2024-05-01 DIAGNOSIS — I480 Paroxysmal atrial fibrillation: Secondary | ICD-10-CM | POA: Insufficient documentation

## 2024-05-01 DIAGNOSIS — I2510 Atherosclerotic heart disease of native coronary artery without angina pectoris: Secondary | ICD-10-CM | POA: Insufficient documentation

## 2024-05-01 MED ORDER — NITROGLYCERIN 0.4 MG SL SUBL: 0.4000 mg | SUBLINGUAL_TABLET | SUBLINGUAL | 6 refills | Status: AC | PRN

## 2024-05-01 NOTE — Progress Notes (Signed)
 Cardiology Office Note:    Date:  05/01/2024   ID:  Dylan Matthews, DOB Nov 23, 1940, MRN [id number]  PCP:  Onita Rush, MD  Cardiologist:  Jennifer JONELLE Crape, MD   Referring MD: Onita Rush, MD    ASSESSMENT:    1. Hyperlipidemia, unspecified hyperlipidemia type   2. Paroxysmal atrial fibrillation (HCC)   3. OSA on CPAP   4. Adequate anticoagulation on anticoagulant therapy   5. Aneurysm of ascending aorta without rupture (HCC)   6. Coronary artery disease involving native coronary artery of native heart without angina pectoris    PLAN:    In order of problems listed above:  Coronary artery disease: Secondary prevention stressed with the patient.  Importance of compliance with diet medication stressed and patient verbalized standing.  He is doing very well with exercise and I congratulated him about this.  Sublingual nitroglycerin  prescription was sent, its protocol and 911 protocol explained and the patient vocalized understanding questions were answered to the patient's satisfaction Thoracic aortic aneurysm: Stable at this time.  Patient mentions to me that it has been many years since the size is stable.  We will recheck this once a year.  Symptoms of dissection discussed. Essential hypertension: Blood pressure is stable and diet was emphasized. Sleep apnea: Sleep health issues were discussed. Mixed dyslipidemia: On lipid-lowering medications followed by primary care.  Goal LDL less than 60. Paroxysmal atrial fibrillation:I discussed with the patient atrial fibrillation, disease process. Management and therapy including rate and rhythm control, anticoagulation benefits and potential risks were discussed extensively with the patient. Patient had multiple questions which were answered to patient's satisfaction. Patient will be seen in follow-up appointment in 6 months or earlier if the patient has any concerns.    Medication Adjustments/Labs and Tests Ordered: Current medicines are  reviewed at length with the patient today.  Concerns regarding medicines are outlined above.  Orders Placed This Encounter  Procedures   EKG 12-Lead   No orders of the defined types were placed in this encounter.    History of Present Illness:    Dylan Matthews is a 83 y.o. male who is being seen today for the evaluation of coronary artery disease and to be established with t the request of Onita Rush, MD. 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  He has past medical history of coronary artery disease post stenting in the remote past, paroxysmal atrial fibrillation, obstructive sleep apnea, essential hypertension and mixed dyslipidemia.  He denies any problems at this time and takes care of activities of daily living.  No chest pain orthopnea or PND.  He is active and exercises at a Zumba class without any symptoms.  At the time of my evaluation, the patient is alert awake oriented and in no distress.  Past Medical History:  Diagnosis Date   Acquired thrombophilia (HCC)    Adequate anticoagulation on anticoagulant therapy    Allergic rhinitis    Asthma    very mild stress asthma (07/18/2017)   Atherosclerosis of native coronary artery without angina pectoris, unspecified whether native or transplanted heart    Block, bundle branch, left    Bronchiectasis (HCC)    very mild in my LLL (07/18/2017)   Chronic bronchitis (HCC)    Degenerative arthritis of hip    both hips (07/18/2017)   GERD (gastroesophageal reflux disease)    a long time ago (07/18/2017)   Hard of hearing    Hyperlipidemia    Mild cognitive impairment  OSA on CPAP    Osteoarthritis    Osteoarthritis of left hip 07/14/2017   Paroxysmal atrial fibrillation (HCC)    Pneumonia    4 times; last time was in ` 2014 (07/18/2017)   Primary osteoarthritis of left hip 07/18/2017   Thoracic ascending aortic aneurysm (HCC)    stable by 04/26/17 [facility])    Past  Surgical History:  Procedure Laterality Date   BUNIONECTOMY WITH HAMMERTOE RECONSTRUCTION Bilateral 1990s-2000   CATARACT EXTRACTION W/ INTRAOCULAR LENS IMPLANT Bilateral 10/2016   JOINT REPLACEMENT     KNEE ARTHROSCOPY Right ~ 2012   TOTAL HIP ARTHROPLASTY Right 2009   Dr. Liam   TOTAL HIP ARTHROPLASTY Left 07/18/2017   TOTAL HIP ARTHROPLASTY Left 07/18/2017   Procedure: TOTAL HIP ARTHROPLASTY ANTERIOR APPROACH;  Surgeon: Liam Lerner, MD;  Location: [facility] OR;  Service: Orthopedics;  Laterality: Left;    Current Medications: Current Meds  Medication Sig   apixaban (ELIQUIS) 5 MG TABS tablet Take 5 mg by mouth 2 (two) times daily.   aspirin  EC 81 MG tablet Take 81 mg by mouth daily.   cetirizine (ZYRTEC) 10 MG tablet Take 10 mg by mouth every morning.   fluticasone -salmeterol (WIXELA INHUB) 250-50 MCG/ACT AEPB Inhale 1 puff into the lungs in the morning and at bedtime.   metoprolol tartrate (LOPRESSOR) 25 MG tablet Take 12.5 mg by mouth 2 (two) times daily.   Multiple Vitamins-Minerals (MULTIVITAMIN WITH MINERALS) tablet Take 1 tablet by mouth daily.   rosuvastatin (CRESTOR) 10 MG tablet Take 10 mg by mouth daily.     Allergies:   Ciprofloxacin   Social History   Socioeconomic History   Marital status: Married    Spouse name: Not on file   Number of children: Not on file   Years of education: Not on file   Highest education level: Not on file  Occupational History   Not on file  Tobacco Use   Smoking status: Former    Current packs/day: 0.00    Average packs/day: 0.1 packs/day for 1 year (0.1 ttl pk-yrs)    Types: Cigarettes    Start date: 07/13/1958    Quit date: 07/14/1959    Years since quitting: 64.8   Smokeless tobacco: Never  Vaping Use   Vaping status: Never Used  Substance and Sexual Activity   Alcohol use: Yes    Alcohol/week: 7.0 standard drinks of alcohol    Types: 7 Glasses of wine per week   Drug use: No   Sexual activity: Not Currently  Other Topics  Concern   Not on file  Social History Narrative   Not on file   Social Drivers of Health   Financial Resource Strain: Not on file  Food Insecurity: Low Risk  (01/04/2024)   Received from [facility]   Hunger Vital Sign    Within the past 12 months, you worried that your food would run out before you got money to buy more: Never true    Within the past 12 months, the food you bought just didn't last and you didn't have money to get more. : Never true  Transportation Needs: No Transportation Needs (01/04/2024)   Received from [facility]    In the past 12 months, has lack of reliable transportation kept you from medical appointments, meetings, work or from getting things needed for daily living? : No  Physical Activity: Not on file  Stress: Not on file  Social Connections: Not  on file     Family History: The patient's family history includes Cancer in his father, mother, and paternal grandmother.  ROS:   Please see the history of present illness.    All other systems reviewed and are negative.  EKGs/Labs/Other Studies Reviewed:    The following studies were reviewed today:  EKG Interpretation Date/Time:  Tuesday May 01 2024 14:39:33 EDT Ventricular Rate:  53 PR Interval:  238 QRS Duration:  168 QT Interval:  480 QTC Calculation: 450 R Axis:   -7  Text Interpretation: Sinus bradycardia with sinus arrhythmia with 1st degree A-V block Left bundle branch block When compared with ECG of 13-Jul-2017 11:07, PR interval has increased Left bundle branch block is now Present Confirmed by Edwyna Backers [phone]) on 05/01/2024 3:04:06 PM     Recent Labs: No results found for requested labs within last 365 days.  Recent Lipid Panel No results found for: CHOL, TRIG, HDL, CHOLHDL, VLDL, LDLCALC, LDLDIRECT  Physical Exam:    VS:  BP 122/70   Pulse (!) 53   Ht 5' 7 (1.702 m)   Wt 173 lb (78.5 kg)   SpO2 96%   BMI 27.10 kg/m     Wt Readings  from Last 3 Encounters:  05/01/24 173 lb (78.5 kg)  07/13/17 194 lb 6.4 oz (88.2 kg)     GEN: Patient is in no acute distress HEENT: Normal NECK: No JVD; No carotid bruits LYMPHATICS: No lymphadenopathy CARDIAC: S1 S2 regular, 2/6 systolic murmur at the apex. RESPIRATORY:  Clear to auscultation without rales, wheezing or rhonchi  ABDOMEN: Soft, non-tender, non-distended MUSCULOSKELETAL:  No edema; No deformity  SKIN: Warm and dry NEUROLOGIC:  Alert and oriented x 3 PSYCHIATRIC:  Normal affect    Signed, Backers JONELLE Edwyna, MD  05/01/2024 3:17 PM    [facility]

## 2024-05-01 NOTE — Patient Instructions (Signed)
 Medication Instructions:  Your physician has recommended you make the following change in your medication:   Use nitroglycerin 1 tablet placed under the tongue at the first sign of chest pain or an angina attack. 1 tablet may be used every 5 minutes as needed, for up to 15 minutes. Do not take more than 3 tablets in 15 minutes. If pain persist call 911 or go to the nearest ED.  *If you need a refill on your cardiac medications before your next appointment, please call your pharmacy*   Lab Work: None ordered If you have labs (blood work) drawn today and your tests are completely normal, you will receive your results only by: MyChart Message (if you have MyChart) OR A paper copy in the mail If you have any lab test that is abnormal or we need to change your treatment, we will call you to review the results.   Testing/Procedures: None ordered   Follow-Up: At [facility], you and your health needs are our priority.  As part of our continuing mission to provide you with exceptional heart care, we have created designated Provider Care Teams.  These Care Teams include your primary Cardiologist (physician) and Advanced Practice Providers (APPs -  Physician Assistants and Nurse Practitioners) who all work together to provide you with the care you need, when you need it.  We recommend signing up for the patient portal called "MyChart".  Sign up information is provided on this After Visit Summary.  MyChart is used to connect with patients for Virtual Visits (Telemedicine).  Patients are able to view lab/test results, encounter notes, upcoming appointments, etc.  Non-urgent messages can be sent to your provider as well.   To learn more about what you can do with MyChart, go to [email or web address].    Your next appointment:   9 month(s)  The format for your next appointment:   In Person  Provider:   Belva Crome, MD    Other Instructions none  Important Information About  [employer]

## 2024-06-07 ENCOUNTER — Encounter: Payer: Self-pay | Admitting: Cardiology

## 2024-06-07 ENCOUNTER — Ambulatory Visit: Attending: Cardiology | Admitting: Cardiology

## 2024-06-07 ENCOUNTER — Ambulatory Visit

## 2024-06-07 VITALS — BP 134/80 | HR 54 | Ht 67.0 in | Wt 178.0 lb

## 2024-06-07 DIAGNOSIS — I480 Paroxysmal atrial fibrillation: Secondary | ICD-10-CM

## 2024-06-07 DIAGNOSIS — I447 Left bundle-branch block, unspecified: Secondary | ICD-10-CM

## 2024-06-07 NOTE — Patient Instructions (Addendum)
 Medication Instructions:  Your physician recommends that you continue on your current medications as directed. Please refer to the Current Medication list given to you today.  *If you need a refill on your cardiac medications before your next appointment, please call your pharmacy*  Testing/Procedures: Echocardiogram  Your physician has requested that you have an echocardiogram. Echocardiography is a painless test that uses sound waves to create images of your heart. It provides your doctor with information about the size and shape of your heart and how well your heart's chambers and valves are working. This procedure takes approximately one hour. There are no restrictions for this procedure. Please do NOT wear cologne, perfume, aftershave, or lotions (deodorant is allowed). Please arrive 15 minutes prior to your appointment time.  Please note: We ask at that you not bring children with you during ultrasound (echo/ vascular) testing. Due to room size and safety concerns, children are not allowed in the ultrasound rooms during exams. Our front office staff cannot provide observation of children in our lobby area while testing is being conducted. An adult accompanying a patient to their appointment will only be allowed in the ultrasound room at the discretion of the ultrasound technician under special circumstances. We apologize for any inconvenience.  Event Monitor Your physician has recommended that you wear an event monitor. Event monitors are medical devices that record the heart's electrical activity. Doctors most often us  these monitors to diagnose arrhythmias. Arrhythmias are problems with the speed or rhythm of the heartbeat. The monitor is a small, portable device. You can wear one while you do your normal daily activities. This is usually used to diagnose what is causing palpitations/syncope (passing out).  Follow-Up: At [facility], you and your health needs are our priority.  As  part of our continuing mission to provide you with exceptional heart care, our providers are all part of one team.  This team includes your primary Cardiologist (physician) and Advanced Practice Providers or APPs (Physician Assistants and Nurse Practitioners) who all work together to provide you with the care you need, when you need it.  Your next appointment:   6 months  Provider:   You will see one of the following Advanced Practice Providers on your designated Care Team:   Charlies Arthur, [place] Ozell Jodie Passey, PA-C Suzann Riddle, NP Brandi Ollis, NP   GEOFFRY HEWS- Long Term Monitor Instructions  Your physician has requested you wear a ZIO patch monitor for 14 days.  This is a single patch monitor. [employer] supplies one patch monitor per enrollment. Additional stickers are not available. Please do not apply patch if you will be having a Nuclear Stress Test,  Echocardiogram, Cardiac CT, MRI, or Chest Xray during the period you would be wearing the  monitor. The patch cannot be worn during these tests. You cannot remove and re-apply the  ZIO XT patch monitor.  Your ZIO patch monitor will be mailed 3 day USPS to your address on file. It may take 3-5 days  to receive your monitor after you have been enrolled.  Once you have received your monitor, please review the enclosed instructions. Your monitor  has already been registered assigning a specific monitor serial # to you.  Billing and Patient Assistance Program Information  We have supplied [employer] with any of your insurance information on file for billing purposes. [employer] offers a sliding scale Patient Assistance Program for patients that do not have  insurance, or whose insurance does not completely cover the cost  of the ZIO monitor.  You must apply for the Patient Assistance Program to qualify for this discounted rate.  To apply, please call [employer] at [phone], select option 4, select option 2, ask to apply for  Patient  Assistance Program. Meredeth will ask your household income, and how many people  are in your household. They will quote your out-of-pocket cost based on that information.  [employer] will also be able to set up a 74-month, interest-free payment plan if needed.  Applying the monitor   Shave hair from upper left chest.  Hold abrader disc by orange tab. Rub abrader in 40 strokes over the upper left chest as  indicated in your monitor instructions.  Clean area with 4 enclosed alcohol pads. Let dry.  Apply patch as indicated in monitor instructions. Patch will be placed under collarbone on left  side of chest with arrow pointing upward.  Rub patch adhesive wings for 2 minutes. Remove white label marked 1. Remove the white  label marked 2. Rub patch adhesive wings for 2 additional minutes.  While looking in a mirror, press and release button in center of patch. A small green light will  flash 3-4 times. This will be your only indicator that the monitor has been turned on.  Do not shower for the first 24 hours. You may shower after the first 24 hours.  Press the button if you feel a symptom. You will hear a small click. Record Date, Time and  Symptom in the Patient Logbook.  When you are ready to remove the patch, follow instructions on the last 2 pages of Patient  Logbook. Stick patch monitor onto the last page of Patient Logbook.  Place Patient Logbook in the blue and white box. Use locking tab on box and tape box closed  securely. The blue and white box has prepaid postage on it. Please place it in the mailbox as  soon as possible. Your physician should have your test results approximately 7 days after the  monitor has been mailed back to [facility].  Call [facility] Customer Care at [phone] if you have questions regarding  your ZIO XT patch monitor. Call them immediately if you see an orange light blinking on your  monitor.  If your monitor falls off in less than 4 days,  contact our Monitor department at [phone].  If your monitor becomes loose or falls off after 4 days call [employer] at [phone] for  suggestions on securing your monitor

## 2024-06-07 NOTE — Progress Notes (Signed)
  Electrophysiology Office Note:    Date:  06/07/2024   ID:  Alm KANDICE Farr, DOB Feb 08, 1941, MRN [id number]  [facility] Cardiologist:  None  [facility] Electrophysiologist:  OLE ONEIDA HOLTS, MD   Referring MD: Onita Rush, MD   Chief Complaint: Left bundle branch block  History of Present Illness:    Mr. Ghuman is an 83 year old man who I am seeing today for an evaluation of paroxysmal atrial fibrillation and a left bundle branch block at the request of Dr. Onita.  The patient has a history of paroxysmal atrial fibrillation, sleep apnea on CPAP, ascending aortic aneurysm, coronary artery disease with a stent in his LAD and hyperlipidemia.  He last saw Dr. Edwyna May 01, 2024.  He is prescribed Eliquis and aspirin .  He also takes metoprolol.  He is with his wife today.  He reports 3 total episodes of atrial fibrillation.  One of them occurred while he was at a Zumba exercise class.  He takes Eliquis for stroke prophylaxis and tolerates the medication without bleeding issues.  He was previously followed for an ascending aortic aneurysm but was discharged from the thoracic practice after 5 years of stability.  He has a stent to his LAD.    Their past medical, social and family history was reviewed.   ROS:   Please see the history of present illness.    All other systems reviewed and are negative.  EKGs/Labs/Other Studies Reviewed:    The following studies were reviewed today:  May 01, 2024 EKG shows sinus rhythm, left bundle branch block       Physical Exam:    VS:  BP 134/80 (BP Location: Left Arm, Patient Position: Sitting, Cuff Size: Normal)   Pulse (!) 54   Ht 5' 7 (1.702 m)   Wt 178 lb (80.7 kg)   SpO2 96%   BMI 27.88 kg/m     Wt Readings from Last 3 Encounters:  06/07/24 178 lb (80.7 kg)  05/01/24 173 lb (78.5 kg)  07/13/17 194 lb 6.4 oz (88.2 kg)     GEN: no distress CARD: RRR, No MRG RESP: No IWOB. CTAB.        ASSESSMENT AND PLAN:    1.  Paroxysmal atrial fibrillation (HCC)   2. Left bundle branch block     #Atrial fibrillation On Eliquis for stroke prophylaxis Unclear burden.  I will order a 2-week ZIO monitor to assess.  #Left bundle branch block #Coronary artery disease No ischemic symptoms.  Remains active.  Continue metoprolol and statin.  Continue aspirin . Order echocardiogram  Follow-up with APP in 6 months.    Signed, OLE ONEIDA. HOLTS, MD, [facility], [facility] 06/07/2024 12:10 PM    Electrophysiology [facility]

## 2024-06-07 NOTE — Progress Notes (Unsigned)
 Enrolled patient for a 14 day Zio XT  monitor to be mailed to patients home

## 2024-07-09 ENCOUNTER — Ambulatory Visit: Payer: Self-pay | Admitting: Cardiology

## 2024-07-09 DIAGNOSIS — I447 Left bundle-branch block, unspecified: Secondary | ICD-10-CM

## 2024-07-09 DIAGNOSIS — I480 Paroxysmal atrial fibrillation: Secondary | ICD-10-CM | POA: Diagnosis not present

## 2024-07-10 ENCOUNTER — Ambulatory Visit (HOSPITAL_COMMUNITY)
Admission: RE | Admit: 2024-07-10 | Discharge: 2024-07-10 | Disposition: A | Discharge status: Home / Self Care | Source: Ambulatory Visit | Attending: Internal Medicine | Admitting: Internal Medicine

## 2024-07-10 DIAGNOSIS — I480 Paroxysmal atrial fibrillation: Secondary | ICD-10-CM | POA: Insufficient documentation

## 2024-07-10 DIAGNOSIS — I447 Left bundle-branch block, unspecified: Secondary | ICD-10-CM | POA: Diagnosis present

## 2024-07-10 MED ORDER — METOPROLOL TARTRATE 25 MG PO TABS: 12.5000 mg | ORAL_TABLET | Freq: Every day | ORAL | 3 refills | Status: AC

## 2024-07-11 LAB — ECHOCARDIOGRAM COMPLETE
Area-P 1/2: 2.6 cm2
S' Lateral: 2.9 cm

## 2024-09-04 ENCOUNTER — Telehealth: Payer: Self-pay | Admitting: Cardiology

## 2024-09-04 NOTE — Telephone Encounter (Signed)
 Patient would like to switch providers from Dr. Edwyna to Dr. Wonda.

## 2025-02-22 ENCOUNTER — Ambulatory Visit: Admitting: Cardiovascular Disease
